# Patient Record
Sex: Female | Born: 1985 | Race: Asian | Hispanic: No | Marital: Married | Smoking: Never smoker
Health system: Southern US, Community
[De-identification: ages and names within clinical notes are randomized; demographics above are authoritative.]

## PROBLEM LIST (undated history)

## (undated) ENCOUNTER — Inpatient Hospital Stay (HOSPITAL_COMMUNITY): Payer: Self-pay

## (undated) DIAGNOSIS — E282 Polycystic ovarian syndrome: Secondary | ICD-10-CM

## (undated) DIAGNOSIS — O24419 Gestational diabetes mellitus in pregnancy, unspecified control: Secondary | ICD-10-CM

## (undated) DIAGNOSIS — D649 Anemia, unspecified: Secondary | ICD-10-CM

## (undated) HISTORY — DX: Gestational diabetes mellitus in pregnancy, unspecified control: O24.419

## (undated) HISTORY — DX: Anemia, unspecified: D64.9

## (undated) HISTORY — PX: NO PAST SURGERIES: SHX2092

---

## 2016-01-27 ENCOUNTER — Encounter (HOSPITAL_COMMUNITY): Payer: Self-pay

## 2016-01-27 ENCOUNTER — Inpatient Hospital Stay (HOSPITAL_COMMUNITY)
Admission: AD | Admit: 2016-01-27 | Discharge: 2016-01-27 | Disposition: A | Discharge status: Home / Self Care | Payer: 59 | Source: Ambulatory Visit | Attending: Obstetrics & Gynecology | Admitting: Obstetrics & Gynecology

## 2016-01-27 ENCOUNTER — Inpatient Hospital Stay (HOSPITAL_COMMUNITY): Payer: 59

## 2016-01-27 DIAGNOSIS — O26891 Other specified pregnancy related conditions, first trimester: Secondary | ICD-10-CM | POA: Diagnosis not present

## 2016-01-27 DIAGNOSIS — O468X1 Other antepartum hemorrhage, first trimester: Secondary | ICD-10-CM

## 2016-01-27 DIAGNOSIS — O99281 Endocrine, nutritional and metabolic diseases complicating pregnancy, first trimester: Secondary | ICD-10-CM | POA: Insufficient documentation

## 2016-01-27 DIAGNOSIS — R102 Pelvic and perineal pain: Secondary | ICD-10-CM

## 2016-01-27 DIAGNOSIS — O208 Other hemorrhage in early pregnancy: Secondary | ICD-10-CM | POA: Diagnosis present

## 2016-01-27 DIAGNOSIS — Z3A01 Less than 8 weeks gestation of pregnancy: Secondary | ICD-10-CM | POA: Insufficient documentation

## 2016-01-27 DIAGNOSIS — E282 Polycystic ovarian syndrome: Secondary | ICD-10-CM | POA: Diagnosis not present

## 2016-01-27 DIAGNOSIS — Z7984 Long term (current) use of oral hypoglycemic drugs: Secondary | ICD-10-CM | POA: Insufficient documentation

## 2016-01-27 DIAGNOSIS — Z3491 Encounter for supervision of normal pregnancy, unspecified, first trimester: Secondary | ICD-10-CM

## 2016-01-27 DIAGNOSIS — O418X10 Other specified disorders of amniotic fluid and membranes, first trimester, not applicable or unspecified: Secondary | ICD-10-CM

## 2016-01-27 HISTORY — DX: Polycystic ovarian syndrome: E28.2

## 2016-01-27 LAB — WET PREP, GENITAL
CLUE CELLS WET PREP: NONE SEEN
Sperm: NONE SEEN
TRICH WET PREP: NONE SEEN
YEAST WET PREP: NONE SEEN

## 2016-01-27 LAB — CBC
HEMATOCRIT: 37.5 % (ref 36.0–46.0)
Hemoglobin: 13.4 g/dL (ref 12.0–15.0)
MCH: 30.6 pg (ref 26.0–34.0)
MCHC: 35.7 g/dL (ref 30.0–36.0)
MCV: 85.6 fL (ref 78.0–100.0)
PLATELETS: 202 10*3/uL (ref 150–400)
RBC: 4.38 MIL/uL (ref 3.87–5.11)
RDW: 13.9 % (ref 11.5–15.5)
WBC: 7.3 10*3/uL (ref 4.0–10.5)

## 2016-01-27 LAB — URINE MICROSCOPIC-ADD ON
BACTERIA UA: NONE SEEN
WBC, UA: NONE SEEN WBC/hpf (ref 0–5)

## 2016-01-27 LAB — URINALYSIS, ROUTINE W REFLEX MICROSCOPIC
BILIRUBIN URINE: NEGATIVE
Glucose, UA: NEGATIVE mg/dL
KETONES UR: NEGATIVE mg/dL
LEUKOCYTES UA: NEGATIVE
NITRITE: NEGATIVE
Protein, ur: NEGATIVE mg/dL
pH: 5.5 (ref 5.0–8.0)

## 2016-01-27 LAB — HCG, QUANTITATIVE, PREGNANCY: hCG, Beta Chain, Quant, S: 1410 m[IU]/mL — ABNORMAL HIGH (ref <5)

## 2016-01-27 LAB — ABO/RH: ABO/RH(D): O POS

## 2016-01-27 LAB — POCT PREGNANCY, URINE: PREG TEST UR: POSITIVE — AB

## 2016-01-27 NOTE — MAU Note (Signed)
Started day before yesterday with spotting, positive UPT 2 weeks ago, today thicker sticky dark red, now dark red, intercourse 2 days ago, cramping onset today, LMP 12/12/15, has history of irregular periods.

## 2016-01-27 NOTE — Discharge Instructions (Signed)
Abdominal Pain During Pregnancy °Abdominal pain is common in pregnancy. Most of the time, it does not cause harm. There are many causes of abdominal pain. Some causes are more serious than others. Some of the causes of abdominal pain in pregnancy are easily diagnosed. Occasionally, the diagnosis takes time to understand. Other times, the cause is not determined. Abdominal pain can be a sign that something is very wrong with the pregnancy, or the pain may have nothing to do with the pregnancy at all. For this reason, always tell your health care provider if you have any abdominal discomfort. °HOME CARE INSTRUCTIONS  °Monitor your abdominal pain for any changes. The following actions may help to alleviate any discomfort you are experiencing: °· Do not have sexual intercourse or put anything in your vagina until your symptoms go away completely. °· Get plenty of rest until your pain improves. °· Drink clear fluids if you feel nauseous. Avoid solid food as long as you are uncomfortable or nauseous. °· Only take over-the-counter or prescription medicine as directed by your health care provider. °· Keep all follow-up appointments with your health care provider. °SEEK IMMEDIATE MEDICAL CARE IF: °· You are bleeding, leaking fluid, or passing tissue from the vagina. °· You have increasing pain or cramping. °· You have persistent vomiting. °· You have painful or bloody urination. °· You have a fever. °· You notice a decrease in your baby's movements. °· You have extreme weakness or feel faint. °· You have shortness of breath, with or without abdominal pain. °· You develop a severe headache with abdominal pain. °· You have abnormal vaginal discharge with abdominal pain. °· You have persistent diarrhea. °· You have abdominal pain that continues even after rest, or gets worse. °MAKE SURE YOU:  °· Understand these instructions. °· Will watch your condition. °· Will get help right away if you are not doing well or get worse. °    °This information is not intended to replace advice given to you by your health care provider. Make sure you discuss any questions you have with your health care provider. °  °Document Released: 05/14/2005 Document Revised: 03/04/2013 Document Reviewed: 12/11/2012 °Elsevier Interactive Patient Education ©2016 Elsevier Inc. °Vaginal Bleeding During Pregnancy, First Trimester °A small amount of bleeding (spotting) from the vagina is relatively common in early pregnancy. It usually stops on its own. Various things may cause bleeding or spotting in early pregnancy. Some bleeding may be related to the pregnancy, and some may not. In most cases, the bleeding is normal and is not a problem. However, bleeding can also be a sign of something serious. Be sure to tell your health care provider about any vaginal bleeding right away. °Some possible causes of vaginal bleeding during the first trimester include: °· Infection or inflammation of the cervix. °· Growths (polyps) on the cervix. °· Miscarriage or threatened miscarriage. °· Pregnancy tissue has developed outside of the uterus and in a fallopian tube (tubal pregnancy). °· Tiny cysts have developed in the uterus instead of pregnancy tissue (molar pregnancy). °HOME CARE INSTRUCTIONS  °Watch your condition for any changes. The following actions may help to lessen any discomfort you are feeling: °· Follow your health care provider's instructions for limiting your activity. If your health care provider orders bed rest, you may need to stay in bed and only get up to use the bathroom. However, your health care provider may allow you to continue light activity. °· If needed, make plans for someone to help with   with your regular activities and responsibilities while you are on bed rest.  Keep track of the number of pads you use each day, how often you change pads, and how soaked (saturated) they are. Write this down.  Do not use tampons. Do not douche.  Do not have sexual  intercourse or orgasms until approved by your health care provider.  If you pass any tissue from your vagina, save the tissue so you can show it to your health care provider.  Only take over-the-counter or prescription medicines as directed by your health care provider.  Do not take aspirin because it can make you bleed.  Keep all follow-up appointments as directed by your health care provider. SEEK MEDICAL CARE IF:  You have any vaginal bleeding during any part of your pregnancy.  You have cramps or labor pains.  You have a fever, not controlled by medicine. SEEK IMMEDIATE MEDICAL CARE IF:   You have severe cramps in your back or belly (abdomen).  You pass large clots or tissue from your vagina.  Your bleeding increases.  You feel light-headed or weak, or you have fainting episodes.  You have chills.  You are leaking fluid or have a gush of fluid from your vagina.  You pass out while having a bowel movement. MAKE SURE YOU:  Understand these instructions.  Will watch your condition.  Will get help right away if you are not doing well or get worse.   This information is not intended to replace advice given to you by your health care provider. Make sure you discuss any questions you have with your health care provider.   Document Released: 02/21/2005 Document Revised: 05/19/2013 Document Reviewed: 01/19/2013 Elsevier Interactive Patient Education [employer]2016 Elsevier Inc.

## 2016-01-27 NOTE — MAU Provider Note (Signed)
Chief Complaint: Vaginal Bleeding and Abdominal Cramping   First Provider Initiated Contact with Patient 01/27/16 1245      SUBJECTIVE HPI: Wanda Roberts is a 29 y.o. G1P0 at 103w4d by LMP who presents to maternity admissions reporting positive pregnancy test 2 weeks ago, and onset of light spotting 2 days ago becoming a little heavier today and associated with pelvic cramping with onset today.  The pain is constant and unchanged, the bleeding is gradually worsening.  Nothing makes her bleeding or pain better or worse.  She is sure of her LMP but has irregular periods r/t PCOS.  She has initial OB visit with [facility] on 9/11.  She denies vaginal itching/burning, urinary symptoms, h/a, dizziness, n/v, or fever/chills.     HPI  Past Medical History:  Diagnosis Date  . PCOS (polycystic ovarian syndrome)    History reviewed. No pertinent surgical history. Social History   Social History  . Marital status: Married    Spouse name: N/A  . Number of children: N/A  . Years of education: N/A   Occupational History  . Not on file.   Social History Main Topics  . Smoking status: Never Smoker  . Smokeless tobacco: Never Used  . Alcohol use No  . Drug use: No  . Sexual activity: Yes    Birth control/ protection: None   Other Topics Concern  . Not on file   Social History Narrative  . No narrative on file   No current facility-administered medications on file prior to encounter.    No current outpatient prescriptions on file prior to encounter.   No Known Allergies  ROS:  Review of Systems  Constitutional: Negative for chills, fatigue and fever.  Respiratory: Negative for shortness of breath.   Cardiovascular: Negative for chest pain.  Genitourinary: Positive for pelvic pain and vaginal bleeding. Negative for difficulty urinating, dysuria, flank pain, vaginal discharge and vaginal pain.  Neurological: Negative for dizziness and headaches.   Psychiatric/Behavioral: Negative.      I have reviewed patient's Past Medical Hx, Surgical Hx, Family Hx, Social Hx, medications and allergies.   Physical Exam   Patient Vitals for the past 24 hrs:  BP Temp Temp src Pulse Resp Height Weight  01/27/16 1222 135/89 98.1 F (36.7 C) Oral 71 18 4\' 11"  (1.499 m) 145 lb 9.6 oz (66 kg)   Constitutional: Well-developed, well-nourished female in no acute distress.  Cardiovascular: normal rate Respiratory: normal effort GI: Abd soft, non-tender. Pos BS x 4 MS: Extremities nontender, no edema, normal ROM Neurologic: Alert and oriented x 4.  GU: Neg CVAT.  PELVIC EXAM: Cervix pink, visually closed, without lesion, small amount dark red bleeding, vaginal walls and external genitalia normal Bimanual exam: Cervix 0/long/high, firm, anterior, neg CMT, uterus nontender, nonenlarged, adnexa without tenderness, enlargement, or mass   LAB RESULTS Results for orders placed or performed during the hospital encounter of 01/27/16 (from the past 24 hour(s))  Urinalysis, Routine w reflex microscopic (not at [facility])     Status: Abnormal   Collection Time: 01/27/16 12:20 PM  Result Value Ref Range   Color, Urine STRAW (A) YELLOW   APPearance CLEAR CLEAR   Specific Gravity, Urine <1.005 (L) 1.005 - 1.030   pH 5.5 5.0 - 8.0   Glucose, UA NEGATIVE NEGATIVE mg/dL   Hgb urine dipstick MODERATE (A) NEGATIVE   Bilirubin Urine NEGATIVE NEGATIVE   Ketones, ur NEGATIVE NEGATIVE mg/dL   Protein, ur NEGATIVE NEGATIVE mg/dL   Nitrite  NEGATIVE NEGATIVE   Leukocytes, UA NEGATIVE NEGATIVE  Urine microscopic-add on     Status: Abnormal   Collection Time: 01/27/16 12:20 PM  Result Value Ref Range   Squamous Epithelial / LPF 0-5 (A) NONE SEEN   WBC, UA NONE SEEN 0 - 5 WBC/hpf   RBC / HPF 0-5 0 - 5 RBC/hpf   Bacteria, UA NONE SEEN NONE SEEN  Pregnancy, urine POC     Status: Abnormal   Collection Time: 01/27/16 12:49 PM  Result Value Ref Range   Preg Test, Ur  POSITIVE (A) NEGATIVE  Wet prep, genital     Status: Abnormal   Collection Time: 01/27/16 12:59 PM  Result Value Ref Range   Yeast Wet Prep HPF POC NONE SEEN NONE SEEN   Trich, Wet Prep NONE SEEN NONE SEEN   Clue Cells Wet Prep HPF POC NONE SEEN NONE SEEN   WBC, Wet Prep HPF POC MODERATE (A) NONE SEEN   Sperm NONE SEEN   CBC     Status: None   Collection Time: 01/27/16  1:00 PM  Result Value Ref Range   WBC 7.3 4.0 - 10.5 K/uL   RBC 4.38 3.87 - 5.11 MIL/uL   Hemoglobin 13.4 12.0 - 15.0 g/dL   HCT 65.737.5 84.636.0 - 96.246.0 %   MCV 85.6 78.0 - 100.0 fL   MCH 30.6 26.0 - 34.0 pg   MCHC 35.7 30.0 - 36.0 g/dL   RDW 95.213.9 84.111.5 - 32.415.5 %   Platelets 202 150 - 400 K/uL  hCG, quantitative, pregnancy     Status: Abnormal   Collection Time: 01/27/16  1:00 PM  Result Value Ref Range   hCG, Beta Chain, Quant, S 1,410 (H) <5 mIU/mL  ABO/Rh     Status: None   Collection Time: 01/27/16  1:00 PM  Result Value Ref Range   ABO/RH(D) O POS     --/--/O POS (09/01 1300)  IMAGING [place] Ob Comp Less 14 Wks  Result Date: 01/27/2016 CLINICAL DATA:  30 year old pregnant female presenting with pelvic pain and spotting. Quantitative beta HCG 1,410. EDC by LMP: 09/17/2016, projecting to an expected gestational age of [redacted] weeks 4 days. EXAM: OBSTETRIC <14 WK US AND TRANSVAGINAL OB US TECHNIQUE: Both transabdominal and transvaginal ultrasound examinations were performed for complete evaluation of the gestation as well as the maternal uterus, adnexal regions, and pelvic cul-de-sac. Transvaginal technique was performed to assess early pregnancy. COMPARISON:  None. FINDINGS: Intrauterine gestational sac: There is a tiny single intrauterine gestational sac with double decidual sac sign. Yolk sac:  Present. Embryo:  Not visualized. Embryonic Cardiac Activity: Not visualized. MSD: 3.0  mm   5 w   1  d Subchorionic hemorrhage: Small perigestational bleed involving approximately 20-30% of the gestational sac circumference. Maternal  uterus/adnexae: Right ovary measures 4.4 x 3.8 x 4.7 cm and contains a 3.5 x 2.4 x 3.9 cm corpus luteum cyst. Left ovary measures 2.9 x 1.3 x 1.2 cm. No suspicious ovarian or adnexal masses. No abnormal free fluid the pelvis. There is a 1.3 x 1.4 x 1.1 cm intramural focus in the right anterior uterine body, which could represent a small intramural fibroid. IMPRESSION: 1. Single intrauterine gestational sac with yolk sac measuring 5 weeks 1 day by mean sac diameter. No embryo detected, which could be due to early gestational age. Small perigestational bleed. Follow-up obstetric scan is recommended in 11-14 days. This recommendation follows SRU consensus guidelines: Diagnostic Criteria for Nonviable Pregnancy Early in the First  Trimester. Malva Limes Med 2013; 829:5621-30. 2. Possible small intramural fibroid in the right anterior uterine body. 3. Right ovarian simple 3.9 cm cyst, probably a corpus luteum cyst. No suspicious ovarian or adnexal masses. Electronically Signed   By: Delbert Phenix M.D.   On: 01/27/2016 14:49   US Ob Transvaginal  Result Date: 01/27/2016 CLINICAL DATA:  30 year old pregnant female presenting with pelvic pain and spotting. Quantitative beta HCG 1,410. EDC by LMP: 09/17/2016, projecting to an expected gestational age of [redacted] weeks 4 days. EXAM: OBSTETRIC <14 WK [place] AND TRANSVAGINAL OB US TECHNIQUE: Both transabdominal and transvaginal ultrasound examinations were performed for complete evaluation of the gestation as well as the maternal uterus, adnexal regions, and pelvic cul-de-sac. Transvaginal technique was performed to assess early pregnancy. COMPARISON:  None. FINDINGS: Intrauterine gestational sac: There is a tiny single intrauterine gestational sac with double decidual sac sign. Yolk sac:  Present. Embryo:  Not visualized. Embryonic Cardiac Activity: Not visualized. MSD: 3.0  mm   5 w   1  d Subchorionic hemorrhage: Small perigestational bleed involving approximately 20-30% of the  gestational sac circumference. Maternal uterus/adnexae: Right ovary measures 4.4 x 3.8 x 4.7 cm and contains a 3.5 x 2.4 x 3.9 cm corpus luteum cyst. Left ovary measures 2.9 x 1.3 x 1.2 cm. No suspicious ovarian or adnexal masses. No abnormal free fluid the pelvis. There is a 1.3 x 1.4 x 1.1 cm intramural focus in the right anterior uterine body, which could represent a small intramural fibroid. IMPRESSION: 1. Single intrauterine gestational sac with yolk sac measuring 5 weeks 1 day by mean sac diameter. No embryo detected, which could be due to early gestational age. Small perigestational bleed. Follow-up obstetric scan is recommended in 11-14 days. This recommendation follows SRU consensus guidelines: Diagnostic Criteria for Nonviable Pregnancy Early in the First Trimester. Malva Limes Med 2013; 865:7846-96. 2. Possible small intramural fibroid in the right anterior uterine body. 3. Right ovarian simple 3.9 cm cyst, probably a corpus luteum cyst. No suspicious ovarian or adnexal masses. Electronically Signed   By: Delbert Phenix M.D.   On: 01/27/2016 14:49    MAU Management/MDM: Ordered labs and reviewed results.  IUP on [place], GS and YS with no FP yet.  Small SCH noted.  Pt to f/u in office as scheduled for appointment/ultrasound.  Bleeding precautions reviewed. Pt stable at time of discharge.  ASSESSMENT 1. Normal IUP (intrauterine pregnancy) on prenatal ultrasound, first trimester   2. Pelvic pain affecting pregnancy in first trimester, antepartum   3. Subchorionic hemorrhage in first trimester     PLAN Discharge home with bleeding precautions    Medication List    TAKE these medications   metFORMIN 1000 MG tablet Commonly known as:  GLUCOPHAGE Take 1,000 mg by mouth at bedtime.   prenatal multivitamin Tabs tablet Take 1 tablet by mouth daily at 12 noon.      Follow-up Information    [facility] .   Why:  As scheduled for appointment Contact information: [address] [place] [address] [phone]           Sharen Counter Certified Nurse-Midwife 01/27/2016  3:21 PM

## 2016-01-28 LAB — HIV ANTIBODY (ROUTINE TESTING W REFLEX): HIV SCREEN 4TH GENERATION: NONREACTIVE

## 2016-01-29 ENCOUNTER — Inpatient Hospital Stay (HOSPITAL_COMMUNITY)
Admission: AD | Admit: 2016-01-29 | Discharge: 2016-01-30 | Disposition: A | Discharge status: Home / Self Care | Payer: 59 | Source: Ambulatory Visit | Attending: Obstetrics and Gynecology | Admitting: Obstetrics and Gynecology

## 2016-01-29 ENCOUNTER — Inpatient Hospital Stay (HOSPITAL_COMMUNITY): Payer: 59

## 2016-01-29 ENCOUNTER — Encounter (HOSPITAL_COMMUNITY): Payer: Self-pay

## 2016-01-29 DIAGNOSIS — Z3A01 Less than 8 weeks gestation of pregnancy: Secondary | ICD-10-CM | POA: Diagnosis not present

## 2016-01-29 DIAGNOSIS — O209 Hemorrhage in early pregnancy, unspecified: Secondary | ICD-10-CM | POA: Diagnosis present

## 2016-01-29 DIAGNOSIS — O3680X0 Pregnancy with inconclusive fetal viability, not applicable or unspecified: Secondary | ICD-10-CM

## 2016-01-29 DIAGNOSIS — O200 Threatened abortion: Secondary | ICD-10-CM | POA: Diagnosis not present

## 2016-01-29 LAB — URINALYSIS, ROUTINE W REFLEX MICROSCOPIC
BILIRUBIN URINE: NEGATIVE
Glucose, UA: NEGATIVE mg/dL
KETONES UR: NEGATIVE mg/dL
Leukocytes, UA: NEGATIVE
NITRITE: NEGATIVE
PROTEIN: NEGATIVE mg/dL
Specific Gravity, Urine: <1.005 — ABNORMAL LOW (ref 1.005–1.030)
pH: 5 (ref 5.0–8.0)

## 2016-01-29 LAB — HCG, QUANTITATIVE, PREGNANCY: hCG, Beta Chain, Quant, S: 2126 m[IU]/mL — ABNORMAL HIGH (ref <5)

## 2016-01-29 LAB — CBC
HEMATOCRIT: 37.2 % (ref 36.0–46.0)
HEMOGLOBIN: 13.3 g/dL (ref 12.0–15.0)
MCH: 30.4 pg (ref 26.0–34.0)
MCHC: 35.8 g/dL (ref 30.0–36.0)
MCV: 84.9 fL (ref 78.0–100.0)
Platelets: 201 10*3/uL (ref 150–400)
RBC: 4.38 MIL/uL (ref 3.87–5.11)
RDW: 13.9 % (ref 11.5–15.5)
WBC: 9.4 10*3/uL (ref 4.0–10.5)

## 2016-01-29 LAB — URINE MICROSCOPIC-ADD ON

## 2016-01-29 NOTE — MAU Provider Note (Signed)
History   [id number]   Chief Complaint  Patient presents with  . Vaginal Bleeding    HPI Wanda Roberts is a 30 y.o. female  G1P0 at 7324w6d IUP here with report of increased vaginal bleeding and passing clots today.  Bleeding associated with mild pelvic cramping.  Seen in MAU on 01/27/16 for light spotting and cramping.  BHCG was 1410 and ultrasound showed single intrauterine gestational sac with yolk sac measuring 5 weeks 1 day by mean sac diameter. No embryo detected, which could be due to early gestational age. Small perigestational bleed.    Patient's last menstrual period was 12/12/2015.  OB History  Gravida Para Term Preterm AB Living  1            SAB TAB Ectopic Multiple Live Births               # Outcome Date GA Lbr Len/2nd Weight Sex Delivery Anes PTL Lv  1 Current               Past Medical History:  Diagnosis Date  . PCOS (polycystic ovarian syndrome)     Family History  Problem Relation Age of Onset  . Diabetes Father   . Cancer Maternal Grandmother     Social History   Social History  . Marital status: Married    Spouse name: N/A  . Number of children: N/A  . Years of education: N/A   Social History Main Topics  . Smoking status: Never Smoker  . Smokeless tobacco: Never Used  . Alcohol use No  . Drug use: No  . Sexual activity: Yes    Birth control/ protection: None   Other Topics Concern  . None   Social History Narrative  . None    No Known Allergies  No current facility-administered medications on file prior to encounter.    Current Outpatient Prescriptions on File Prior to Encounter  Medication Sig Dispense Refill  . metFORMIN (GLUCOPHAGE) 1000 MG tablet Take 1,000 mg by mouth at bedtime.    . Prenatal Vit-Fe Fumarate-FA (PRENATAL MULTIVITAMIN) TABS tablet Take 1 tablet by mouth daily at 12 noon.       Review of Systems  Genitourinary: Positive for pelvic pain (cramping) and vaginal bleeding.  All other systems reviewed and  are negative.    Physical Exam   Vitals:   01/29/16 2200  BP: 128/89  Pulse: 78  Resp: 17  Temp: 99.2 F (37.3 C)  TempSrc: Oral  SpO2: 97%  Weight: 145 lb (65.8 kg)  Height: 4\' 11"  (1.499 m)    Physical Exam  Constitutional: She is oriented to person, place, and time. She appears well-developed and well-nourished.  HENT:  Head: Normocephalic.  Neck: Normal range of motion. Neck supple.  Cardiovascular: Normal rate, regular rhythm and normal heart sounds.   Respiratory: Effort normal and breath sounds normal. No respiratory distress.  GI: Soft. There is no tenderness.  Genitourinary: Cervix exhibits no motion tenderness. There is bleeding (scant, no clots seen) in the vagina.  Musculoskeletal: Normal range of motion. She exhibits no edema.  Neurological: She is alert and oriented to person, place, and time.  Skin: Skin is warm and dry.   2220 Report given to Dr. Elon SpannerLeger > Reviewed HPI/Exam/prior visit > give bleeding precautions and have patient follow-up in office; may do ultrasound if patient requests  2230 After discussing options with patient; pt desires to see if BHCG levels are rising as expected > BHCG ordered MAU  Course  Procedures  MDM Results for orders placed or performed during the hospital encounter of 01/29/16 (from the past 24 hour(s))  Urinalysis, Routine w reflex microscopic (not at [facility])     Status: Abnormal   Collection Time: 01/29/16  9:50 PM  Result Value Ref Range   Color, Urine YELLOW YELLOW   APPearance CLEAR CLEAR   Specific Gravity, Urine <1.005 (L) 1.005 - 1.030   pH 5.0 5.0 - 8.0   Glucose, UA NEGATIVE NEGATIVE mg/dL   Hgb urine dipstick LARGE (A) NEGATIVE   Bilirubin Urine NEGATIVE NEGATIVE   Ketones, ur NEGATIVE NEGATIVE mg/dL   Protein, ur NEGATIVE NEGATIVE mg/dL   Nitrite NEGATIVE NEGATIVE   Leukocytes, UA NEGATIVE NEGATIVE  Urine microscopic-add on     Status: Abnormal   Collection Time: 01/29/16  9:50 PM  Result Value Ref Range    Squamous Epithelial / LPF 0-5 (A) NONE SEEN   WBC, UA 0-5 0 - 5 WBC/hpf   RBC / HPF 6-30 0 - 5 RBC/hpf   Bacteria, UA FEW (A) NONE SEEN  hCG, quantitative, pregnancy     Status: Abnormal   Collection Time: 01/29/16 10:33 PM  Result Value Ref Range   hCG, Beta Chain, Quant, S 2,126 (H) <5 mIU/mL  CBC     Status: None   Collection Time: 01/29/16 10:33 PM  Result Value Ref Range   WBC 9.4 4.0 - 10.5 K/uL   RBC 4.38 3.87 - 5.11 MIL/uL   Hemoglobin 13.3 12.0 - 15.0 g/dL   HCT 16.1 09.6 - 04.5 %   MCV 84.9 78.0 - 100.0 fL   MCH 30.4 26.0 - 34.0 pg   MCHC 35.8 30.0 - 36.0 g/dL   RDW 40.9 81.1 - 91.4 %   Platelets 201 150 - 400 K/uL    Ultrasound: Cystic structure in the endometrium as described may represent an early gestational sac, or a blighted ovum. No definite yolk sac or fetal pole identified at this time. Correlation with serial HCG levels and follow-up with ultrasound recommended.  Assessment and Plan  First Trimester Bleeding  Plan: Discharge home Bleeding precautions Keep scheduled appointment for Tuesday   Marlis Edelson, [place] 01/29/2016 11:57 PM

## 2016-01-29 NOTE — MAU Note (Signed)
Pt states that she had some brown spotting a few days ago, but now it is a little heavier and has passed some small clots. Having some mild abdominal cramping.

## 2016-01-29 NOTE — Discharge Instructions (Signed)
BHCG: 9/1 1410 9/3 2126

## 2016-01-30 ENCOUNTER — Encounter (HOSPITAL_COMMUNITY): Payer: Self-pay | Admitting: *Deleted

## 2016-01-30 ENCOUNTER — Inpatient Hospital Stay (EMERGENCY_DEPARTMENT_HOSPITAL)
Admission: AD | Admit: 2016-01-30 | Discharge: 2016-01-30 | Disposition: A | Discharge status: Home / Self Care | Payer: 59 | Source: Ambulatory Visit | Attending: Obstetrics and Gynecology | Admitting: Obstetrics and Gynecology

## 2016-01-30 DIAGNOSIS — O200 Threatened abortion: Secondary | ICD-10-CM

## 2016-01-30 DIAGNOSIS — Z3A01 Less than 8 weeks gestation of pregnancy: Secondary | ICD-10-CM | POA: Insufficient documentation

## 2016-01-30 DIAGNOSIS — O209 Hemorrhage in early pregnancy, unspecified: Secondary | ICD-10-CM

## 2016-01-30 LAB — CBC
HEMATOCRIT: 37.5 % (ref 36.0–46.0)
Hemoglobin: 13.4 g/dL (ref 12.0–15.0)
MCH: 30.5 pg (ref 26.0–34.0)
MCHC: 35.7 g/dL (ref 30.0–36.0)
MCV: 85.2 fL (ref 78.0–100.0)
Platelets: 198 10*3/uL (ref 150–400)
RBC: 4.4 MIL/uL (ref 3.87–5.11)
RDW: 13.9 % (ref 11.5–15.5)
WBC: 10.2 10*3/uL (ref 4.0–10.5)

## 2016-01-30 LAB — HCG, QUANTITATIVE, PREGNANCY: hCG, Beta Chain, Quant, S: 2114 m[IU]/mL — ABNORMAL HIGH (ref <5)

## 2016-01-30 NOTE — Discharge Instructions (Signed)
Miscarriage  A miscarriage is the sudden loss of an unborn baby (fetus) before the 20th week of pregnancy. Most miscarriages happen in the first 3 months of pregnancy. Sometimes, it happens before a woman even knows she is pregnant. A miscarriage is also called a "spontaneous miscarriage" or "early pregnancy loss." Having a miscarriage can be an emotional experience. Talk with your caregiver about any questions you may have about miscarrying, the grieving process, and your future pregnancy plans.  CAUSES    Problems with the fetal chromosomes that make it impossible for the baby to develop normally. Problems with the baby's genes or chromosomes are most often the result of errors that occur, by chance, as the embryo divides and grows. The problems are not inherited from the parents.   Infection of the cervix or uterus.    Hormone problems.    Problems with the cervix, such as having an incompetent cervix. This is when the tissue in the cervix is not strong enough to hold the pregnancy.    Problems with the uterus, such as an abnormally shaped uterus, uterine fibroids, or congenital abnormalities.    Certain medical conditions.    Smoking, drinking alcohol, or taking illegal drugs.    Trauma.   Often, the cause of a miscarriage is unknown.   SYMPTOMS    Vaginal bleeding or spotting, with or without cramps or pain.   Pain or cramping in the abdomen or lower back.   Passing fluid, tissue, or blood clots from the vagina.  DIAGNOSIS   Your caregiver will perform a physical exam. You may also have an ultrasound to confirm the miscarriage. Blood or urine tests may also be ordered.  TREATMENT    Sometimes, treatment is not necessary if you naturally pass all the fetal tissue that was in the uterus. If some of the fetus or placenta remains in the body (incomplete miscarriage), tissue left behind may become infected and must be removed. Usually, a dilation and curettage (D and C) procedure is performed.  During a D and C procedure, the cervix is widened (dilated) and any remaining fetal or placental tissue is gently removed from the uterus.   Antibiotic medicines are prescribed if there is an infection. Other medicines may be given to reduce the size of the uterus (contract) if there is a lot of bleeding.   If you have Rh negative blood and your baby was Rh positive, you will need a Rh immunoglobulin shot. This shot will protect any future baby from having Rh blood problems in future pregnancies.  HOME CARE INSTRUCTIONS    Your caregiver may order bed rest or may allow you to continue light activity. Resume activity as directed by your caregiver.   Have someone help with home and family responsibilities during this time.    Keep track of the number of sanitary pads you use each day and how soaked (saturated) they are. Write down this information.    Do not use tampons. Do not douche or have sexual intercourse until approved by your caregiver.    Only take over-the-counter or prescription medicines for pain or discomfort as directed by your caregiver.    Do not take aspirin. Aspirin can cause bleeding.    Keep all follow-up appointments with your caregiver.    If you or your partner have problems with grieving, talk to your caregiver or seek counseling to help cope with the pregnancy loss. Allow enough time to grieve before trying to get pregnant again.     SEEK IMMEDIATE MEDICAL CARE IF:    You have severe cramps or pain in your back or abdomen.   You have a fever.   You pass large blood clots (walnut-sized or larger) ortissue from your vagina. Save any tissue for your caregiver to inspect.    Your bleeding increases.    You have a thick, bad-smelling vaginal discharge.   You become lightheaded, weak, or you faint.    You have chills.   MAKE SURE YOU:   Understand these instructions.   Will watch your condition.   Will get help right away if you are not doing well or get worse.     This  information is not intended to replace advice given to you by your health care provider. Make sure you discuss any questions you have with your health care provider.     Document Released: 11/07/2000 Document Revised: 09/08/2012 Document Reviewed: 07/03/2011  Elsevier Interactive Patient Education 2016 [employer]

## 2016-01-30 NOTE — MAU Provider Note (Signed)
History     CSN: [id number]  Arrival date and time: 01/30/16 2028   First Provider Initiated Contact with Patient 01/30/16 2108      Chief Complaint  Patient presents with  . Vaginal Bleeding   Vaginal Bleeding  The patient's primary symptoms include pelvic pain and vaginal bleeding. This is a new problem. The current episode started 1 to 4 weeks ago. The problem occurs constantly. The problem has been gradually worsening. The problem affects both sides. She is pregnant. Associated symptoms include abdominal pain. Pertinent negatives include no chills, constipation, diarrhea, dysuria, fever, frequency, nausea, urgency or vomiting. The vaginal discharge was bloody. The vaginal bleeding is lighter than menses. She has been passing clots (about the size of a quarter. ). She has not been passing tissue (unsure ). Nothing aggravates the symptoms. She has tried acetaminophen for the symptoms. The treatment provided no relief. Menstrual history: LMP 12/12/15     Past Medical History:  Diagnosis Date  . PCOS (polycystic ovarian syndrome)     History reviewed. No pertinent surgical history.  Family History  Problem Relation Age of Onset  . Diabetes Father   . Cancer Maternal Grandmother     Social History  Substance Use Topics  . Smoking status: Never Smoker  . Smokeless tobacco: Never Used  . Alcohol use No    Allergies: No Known Allergies  Prescriptions Prior to Admission  Medication Sig Dispense Refill Last Dose  . metFORMIN (GLUCOPHAGE) 1000 MG tablet Take 1,000 mg by mouth at bedtime.   01/29/2016 at Unknown time  . Prenatal Vit-Fe Fumarate-FA (PRENATAL MULTIVITAMIN) TABS tablet Take 1 tablet by mouth daily at 12 noon.   01/29/2016 at Unknown time    Review of Systems  Constitutional: Negative for chills and fever.  Gastrointestinal: Positive for abdominal pain. Negative for constipation, diarrhea, nausea and vomiting.  Genitourinary: Positive for pelvic pain and vaginal  bleeding. Negative for dysuria, frequency and urgency.   Physical Exam   Blood pressure 128/80, pulse 85, temperature 98.5 F (36.9 C), temperature source Oral, resp. rate 20, height 4\' 11"  (1.499 m), weight 145 lb 8 oz (66 kg), last menstrual period 12/12/2015.  Physical Exam  Nursing note and vitals reviewed. Constitutional: She is oriented to person, place, and time. She appears well-developed and well-nourished. No distress.  HENT:  Head: Normocephalic.  Cardiovascular: Normal rate.   Respiratory: Effort normal.  GI: Soft.  Musculoskeletal: Normal range of motion.  Neurological: She is alert and oriented to person, place, and time.  Skin: Skin is warm and dry.  Psychiatric: She has a normal mood and affect.   Results for GREGG, HOLSTER (MRN [id number]) as of 01/30/2016 22:41  Ref. Range 01/27/2016 13:00 01/29/2016 21:50 01/29/2016 22:33 01/30/2016 20:42  HCG, Beta Chain, Quant, S Latest Ref Range: <5 mIU/mL 1,410 (H)  2,126 (H) 2,114 (H)   MAU Course  Procedures  MDM Reviewed [place] from 9/1 (unable to see images in epic, but [place] tech loaded images from PACS for me to view). I was unable to discern a yolk sac from the first [place] and the second [place]. Unsure if there is any significant change from 9/1 to 9/3.   2259: D/W Dr. Marcelle Overlie, ok for DC home. FU in the office tomorrow as planned.   Assessment and Plan   1. Threatened abortion in early pregnancy   2. Vaginal bleeding in pregnancy, first trimester    DC home Comfort measures reviewed  1st Trimester precautions  Bleeding precautions  Ectopic precautions RX: none  Return to MAU as needed FU with OB as planned  Follow-up Information    ADKINS,GRETCHEN, MD .   Specialty:  Obstetrics and Gynecology Contact information: [address], [address] [place] [id number] [phone]            Tawnya CrookHogan, Jalexis Breed Donovan 01/30/2016, 9:21 PM

## 2016-01-30 NOTE — MAU Note (Signed)
PT  SAYS SHE WAS HERE  LAST NIGHT    WITH VAG  BLEEDING-   MILD  CRAMPS.    TODAY STARTED CRAMPING  STRONGER.    SHE TOOK TYLENOL REG STR -    2 TABS  -  NO RELIEF  .    IN B-ROOM  BEFORE  SHE   CAME - PASSED  SOMETHING.  BLEEDING  IS MORE  THAN  YESTERDAY .   IN TRIAGE  - NOTHING  ON PAD

## 2016-01-31 LAB — GC/CHLAMYDIA PROBE AMP (~~LOC~~) NOT AT ARMC
Chlamydia: NEGATIVE
Neisseria Gonorrhea: NEGATIVE

## 2016-08-27 LAB — OB RESULTS CONSOLE ANTIBODY SCREEN: Antibody Screen: NEGATIVE

## 2016-08-27 LAB — OB RESULTS CONSOLE RPR: RPR: NONREACTIVE

## 2016-08-27 LAB — OB RESULTS CONSOLE HIV ANTIBODY (ROUTINE TESTING): HIV: NONREACTIVE

## 2016-08-27 LAB — OB RESULTS CONSOLE ABO/RH: RH Type: POSITIVE

## 2016-08-27 LAB — OB RESULTS CONSOLE HEPATITIS B SURFACE ANTIGEN: HEP B S AG: NEGATIVE

## 2016-08-27 LAB — OB RESULTS CONSOLE GC/CHLAMYDIA
CHLAMYDIA, DNA PROBE: NEGATIVE
Gonorrhea: NEGATIVE

## 2016-08-27 LAB — OB RESULTS CONSOLE RUBELLA ANTIBODY, IGM: Rubella: IMMUNE

## 2016-12-01 ENCOUNTER — Encounter (HOSPITAL_COMMUNITY): Payer: Self-pay

## 2017-01-16 ENCOUNTER — Encounter: Payer: 59 | Attending: Obstetrics and Gynecology | Admitting: Registered"

## 2017-01-16 DIAGNOSIS — E119 Type 2 diabetes mellitus without complications: Secondary | ICD-10-CM | POA: Insufficient documentation

## 2017-01-16 DIAGNOSIS — Z713 Dietary counseling and surveillance: Secondary | ICD-10-CM | POA: Insufficient documentation

## 2017-01-16 DIAGNOSIS — R7309 Other abnormal glucose: Secondary | ICD-10-CM

## 2017-01-21 ENCOUNTER — Encounter: Payer: Self-pay | Admitting: Registered"

## 2017-01-21 NOTE — Progress Notes (Signed)
Patient was seen on 01/16/2017 for Gestational Diabetes self-management class at the [facility]. The following learning objectives were met by the patient during this course:   States the definition of Gestational Diabetes  States why dietary management is important in controlling blood glucose  Describes the effects each nutrient has on blood glucose levels  Demonstrates ability to create a balanced meal plan  Demonstrates carbohydrate counting   States when to check blood glucose levels  Demonstrates proper blood glucose monitoring techniques  States the effect of stress and exercise on blood glucose levels  States the importance of limiting caffeine and abstaining from alcohol and smoking  Blood glucose monitor given: [employer] Lot # [id number] X Exp: 02/24/2017 Blood glucose reading: 81  Patient instructed to monitor glucose levels: FBS: 60 - <95 1 hour: <140 2 hour: <120  Patient received handouts:  Nutrition Diabetes and Pregnancy  Carbohydrate Counting List  Patient will be seen for follow-up as needed.

## 2017-03-22 ENCOUNTER — Telehealth (HOSPITAL_COMMUNITY): Payer: Self-pay | Admitting: *Deleted

## 2017-03-22 ENCOUNTER — Encounter (HOSPITAL_COMMUNITY): Payer: Self-pay | Admitting: *Deleted

## 2017-03-22 NOTE — Telephone Encounter (Signed)
Preadmission screen  

## 2017-03-23 LAB — OB RESULTS CONSOLE GBS: GBS: NEGATIVE

## 2017-03-28 NOTE — H&P (Signed)
Wanda Roberts is a 31 y.o. female G1 presenting for IOL.  Pregnancy complicated by A2DM, BG controlled with glyburide - somewhat poor control despite increase in glyburide 5mg  bid.   OB History    Gravida Para Term Preterm AB Living   2       1     SAB TAB Ectopic Multiple Live Births   1             Past Medical History:  Diagnosis Date  . Anemia   . Gestational diabetes   . PCOS (polycystic ovarian syndrome)    Past Surgical History:  Procedure Laterality Date  . NO PAST SURGERIES     Family History: family history includes Breast cancer in her maternal aunt; Cancer in her maternal grandmother; Diabetes in her father; Hypertension in her father and mother. Social History:  reports that she has never smoked. She has never used smokeless tobacco. She reports that she does not drink alcohol or use drugs.     Maternal Diabetes: Yes:  Diabetes Type:  Insulin/Medication controlled Genetic Screening: Normal Maternal Ultrasounds/Referrals: Normal Fetal Ultrasounds or other Referrals:  None Maternal Substance Abuse:  No Significant Maternal Medications:  None Significant Maternal Lab Results:  None Other Comments:  None  ROS History   unknown if currently breastfeeding. Exam Physical Exam  Gen - NAD CV - RRR Abd - gravid, NT Ext - NT, no edema Cvx 2cm Prenatal labs: ABO, Rh: O/Positive/-- (04/02 0000) Antibody: Negative (04/02 0000) Rubella: Immune (04/02 0000) RPR: Nonreactive (04/02 0000)  HBsAg: Negative (04/02 0000)  HIV: Non-reactive (04/02 0000)  GBS:   neg  Assessment/Plan: Admit Pitocin/AROM IOL   Wanda Roberts 03/28/2017, 8:46 AM

## 2017-03-29 ENCOUNTER — Inpatient Hospital Stay (HOSPITAL_COMMUNITY): Payer: 59 | Admitting: Anesthesiology

## 2017-03-29 ENCOUNTER — Encounter (HOSPITAL_COMMUNITY)
Admission: RE | Disposition: A | Discharge status: Home / Self Care | Payer: Self-pay | Source: Ambulatory Visit | Attending: Obstetrics and Gynecology

## 2017-03-29 ENCOUNTER — Encounter (HOSPITAL_COMMUNITY): Payer: Self-pay

## 2017-03-29 ENCOUNTER — Inpatient Hospital Stay (HOSPITAL_COMMUNITY)
Admission: RE | Admit: 2017-03-29 | Discharge: 2017-04-02 | DRG: 788 | Disposition: A | Discharge status: Home / Self Care | Payer: 59 | Source: Ambulatory Visit | Attending: Obstetrics and Gynecology | Admitting: Obstetrics and Gynecology

## 2017-03-29 VITALS — BP 106/65 | HR 89 | Temp 98.2°F | Resp 20 | Ht 59.0 in | Wt 163.0 lb

## 2017-03-29 DIAGNOSIS — O24425 Gestational diabetes mellitus in childbirth, controlled by oral hypoglycemic drugs: Principal | ICD-10-CM | POA: Diagnosis present

## 2017-03-29 DIAGNOSIS — Z98891 History of uterine scar from previous surgery: Secondary | ICD-10-CM

## 2017-03-29 DIAGNOSIS — Z3A38 38 weeks gestation of pregnancy: Secondary | ICD-10-CM

## 2017-03-29 DIAGNOSIS — O24419 Gestational diabetes mellitus in pregnancy, unspecified control: Secondary | ICD-10-CM | POA: Diagnosis present

## 2017-03-29 LAB — CBC
HCT: 35.9 % — ABNORMAL LOW (ref 36.0–46.0)
Hemoglobin: 12.5 g/dL (ref 12.0–15.0)
MCH: 29.8 pg (ref 26.0–34.0)
MCHC: 34.8 g/dL (ref 30.0–36.0)
MCV: 85.5 fL (ref 78.0–100.0)
PLATELETS: 178 10*3/uL (ref 150–400)
RBC: 4.2 MIL/uL (ref 3.87–5.11)
RDW: 15.7 % — AB (ref 11.5–15.5)
WBC: 8.4 10*3/uL (ref 4.0–10.5)

## 2017-03-29 LAB — TYPE AND SCREEN
ABO/RH(D): O POS
Antibody Screen: NEGATIVE

## 2017-03-29 LAB — RPR: RPR: NONREACTIVE

## 2017-03-29 SURGERY — Surgical Case
Anesthesia: Epidural

## 2017-03-29 MED ORDER — METOCLOPRAMIDE HCL 5 MG/ML IJ SOLN
INTRAMUSCULAR | Status: AC
  Filled 2017-03-29: qty 2

## 2017-03-29 MED ORDER — PHENYLEPHRINE 40 MCG/ML (10ML) SYRINGE FOR IV PUSH (FOR BLOOD PRESSURE SUPPORT): 80.0000 ug | PREFILLED_SYRINGE | INTRAVENOUS | Status: DC | PRN

## 2017-03-29 MED ORDER — OXYCODONE-ACETAMINOPHEN 5-325 MG PO TABS: 1.0000 | ORAL_TABLET | ORAL | Status: DC | PRN

## 2017-03-29 MED ORDER — DIPHENHYDRAMINE HCL 50 MG/ML IJ SOLN: 12.5000 mg | INTRAMUSCULAR | Status: DC | PRN

## 2017-03-29 MED ORDER — LACTATED RINGERS IV SOLN: 500.0000 mL | Freq: Once | INTRAVENOUS | Status: DC

## 2017-03-29 MED ORDER — DEXAMETHASONE SODIUM PHOSPHATE 10 MG/ML IJ SOLN
INTRAMUSCULAR | Status: AC
  Filled 2017-03-29: qty 1

## 2017-03-29 MED ORDER — CEFAZOLIN SODIUM-DEXTROSE 2-3 GM-%(50ML) IV SOLR
INTRAVENOUS | Status: AC
  Filled 2017-03-29: qty 50

## 2017-03-29 MED ORDER — FENTANYL 2.5 MCG/ML BUPIVACAINE 1/10 % EPIDURAL INFUSION (WH - ANES)
14.0000 mL/h | INTRAMUSCULAR | Status: DC | PRN
  Administered 2017-03-29 (×2): 14 mL/h via EPIDURAL
  Filled 2017-03-29 (×2): qty 100

## 2017-03-29 MED ORDER — EPHEDRINE 5 MG/ML INJ: 10.0000 mg | INTRAVENOUS | Status: DC | PRN

## 2017-03-29 MED ORDER — OXYTOCIN 40 UNITS IN LACTATED RINGERS INFUSION - SIMPLE MED
1.0000 m[IU]/min | INTRAVENOUS | Status: DC
  Administered 2017-03-29: 2 m[IU]/min via INTRAVENOUS
  Filled 2017-03-29: qty 1000

## 2017-03-29 MED ORDER — LIDOCAINE HCL (PF) 1 % IJ SOLN: 30.0000 mL | INTRAMUSCULAR | Status: DC | PRN

## 2017-03-29 MED ORDER — OXYTOCIN 40 UNITS IN LACTATED RINGERS INFUSION - SIMPLE MED: 2.5000 [IU]/h | INTRAVENOUS | Status: DC

## 2017-03-29 MED ORDER — ONDANSETRON HCL 4 MG/2ML IJ SOLN
4.0000 mg | Freq: Four times a day (QID) | INTRAMUSCULAR | Status: DC | PRN
  Administered 2017-03-29: 4 mg via INTRAVENOUS
  Filled 2017-03-29: qty 2

## 2017-03-29 MED ORDER — LACTATED RINGERS IV SOLN
500.0000 mL | INTRAVENOUS | Status: DC | PRN
  Administered 2017-03-29: 500 mL via INTRAVENOUS

## 2017-03-29 MED ORDER — CEFAZOLIN SODIUM-DEXTROSE 2-4 GM/100ML-% IV SOLN
2.0000 g | Freq: Once | INTRAVENOUS | Status: AC
Start: 2017-03-29 — End: 2017-03-29
  Administered 2017-03-29: 2 g via INTRAVENOUS

## 2017-03-29 MED ORDER — PHENYLEPHRINE 40 MCG/ML (10ML) SYRINGE FOR IV PUSH (FOR BLOOD PRESSURE SUPPORT)
80.0000 ug | PREFILLED_SYRINGE | INTRAVENOUS | Status: DC | PRN
  Filled 2017-03-29 (×2): qty 10

## 2017-03-29 MED ORDER — LIDOCAINE-EPINEPHRINE (PF) 2 %-1:200000 IJ SOLN
INTRAMUSCULAR | Status: DC | PRN
  Administered 2017-03-29: 10 mL via EPIDURAL

## 2017-03-29 MED ORDER — MORPHINE SULFATE (PF) 0.5 MG/ML IJ SOLN
INTRAMUSCULAR | Status: AC
  Filled 2017-03-29: qty 10

## 2017-03-29 MED ORDER — OXYTOCIN 10 UNIT/ML IJ SOLN
INTRAMUSCULAR | Status: AC
  Filled 2017-03-29: qty 4

## 2017-03-29 MED ORDER — SODIUM CHLORIDE 0.9 % IR SOLN
Status: DC | PRN
  Administered 2017-03-29: 1

## 2017-03-29 MED ORDER — DEXAMETHASONE SODIUM PHOSPHATE 10 MG/ML IJ SOLN
INTRAMUSCULAR | Status: DC | PRN
  Administered 2017-03-29: 10 mg via INTRAVENOUS

## 2017-03-29 MED ORDER — DIPHENHYDRAMINE HCL 50 MG/ML IJ SOLN
INTRAMUSCULAR | Status: DC | PRN
  Administered 2017-03-29: 12.5 mg via INTRAVENOUS

## 2017-03-29 MED ORDER — DIPHENHYDRAMINE HCL 50 MG/ML IJ SOLN
INTRAMUSCULAR | Status: AC
  Filled 2017-03-29: qty 1

## 2017-03-29 MED ORDER — MEPERIDINE HCL 25 MG/ML IJ SOLN
INTRAMUSCULAR | Status: AC
  Filled 2017-03-29: qty 1

## 2017-03-29 MED ORDER — LACTATED RINGERS IV SOLN
INTRAVENOUS | Status: DC
  Administered 2017-03-29 (×4): via INTRAVENOUS

## 2017-03-29 MED ORDER — SOD CITRATE-CITRIC ACID 500-334 MG/5ML PO SOLN
30.0000 mL | ORAL | Status: DC | PRN
  Administered 2017-03-29: 30 mL via ORAL
  Filled 2017-03-29: qty 15

## 2017-03-29 MED ORDER — ACETAMINOPHEN 325 MG PO TABS: 650.0000 mg | ORAL_TABLET | ORAL | Status: DC | PRN

## 2017-03-29 MED ORDER — METOCLOPRAMIDE HCL 5 MG/ML IJ SOLN
INTRAMUSCULAR | Status: DC | PRN
  Administered 2017-03-29: 10 mg via INTRAVENOUS

## 2017-03-29 MED ORDER — OXYCODONE-ACETAMINOPHEN 5-325 MG PO TABS: 2.0000 | ORAL_TABLET | ORAL | Status: DC | PRN

## 2017-03-29 MED ORDER — OXYTOCIN BOLUS FROM INFUSION: 500.0000 mL | Freq: Once | INTRAVENOUS | Status: DC

## 2017-03-29 MED ORDER — MORPHINE SULFATE (PF) 0.5 MG/ML IJ SOLN
INTRAMUSCULAR | Status: DC | PRN
  Administered 2017-03-29: 1 mg via INTRAVENOUS
  Administered 2017-03-29: 4 mg via EPIDURAL

## 2017-03-29 MED ORDER — SCOPOLAMINE 1 MG/3DAYS TD PT72
MEDICATED_PATCH | TRANSDERMAL | Status: AC
  Filled 2017-03-29: qty 1

## 2017-03-29 MED ORDER — FENTANYL 2.5 MCG/ML BUPIVACAINE 1/10 % EPIDURAL INFUSION (WH - ANES): 14.0000 mL/h | INTRAMUSCULAR | Status: DC | PRN

## 2017-03-29 MED ORDER — OXYTOCIN 10 UNIT/ML IJ SOLN
INTRAVENOUS | Status: DC | PRN
  Administered 2017-03-29: 40 [IU] via INTRAVENOUS

## 2017-03-29 MED ORDER — ONDANSETRON HCL 4 MG/2ML IJ SOLN
INTRAMUSCULAR | Status: AC
  Filled 2017-03-29: qty 2

## 2017-03-29 MED ORDER — TERBUTALINE SULFATE 1 MG/ML IJ SOLN
0.2500 mg | Freq: Once | INTRAMUSCULAR | Status: DC | PRN
  Filled 2017-03-29: qty 1

## 2017-03-29 MED ORDER — LIDOCAINE HCL (PF) 1 % IJ SOLN
INTRAMUSCULAR | Status: DC | PRN
  Administered 2017-03-29: 4 mL
  Administered 2017-03-29: 6 mL via EPIDURAL

## 2017-03-29 MED ORDER — SCOPOLAMINE 1 MG/3DAYS TD PT72
MEDICATED_PATCH | TRANSDERMAL | Status: DC | PRN
  Administered 2017-03-29: 1 via TRANSDERMAL

## 2017-03-29 MED ORDER — MEPERIDINE HCL 25 MG/ML IJ SOLN
INTRAMUSCULAR | Status: DC | PRN
  Administered 2017-03-29 (×2): 12.5 mg via INTRAVENOUS

## 2017-03-29 MED ORDER — PHENYLEPHRINE 40 MCG/ML (10ML) SYRINGE FOR IV PUSH (FOR BLOOD PRESSURE SUPPORT)
80.0000 ug | PREFILLED_SYRINGE | INTRAVENOUS | Status: DC | PRN
  Administered 2017-03-29 (×2): 80 ug via INTRAVENOUS

## 2017-03-29 MED ORDER — ONDANSETRON HCL 4 MG/2ML IJ SOLN
INTRAMUSCULAR | Status: DC | PRN
  Administered 2017-03-29: 4 mg via INTRAVENOUS

## 2017-03-29 MED ORDER — LACTATED RINGERS IV SOLN
INTRAVENOUS | Status: DC | PRN
  Administered 2017-03-29: 23:00:00 via INTRAVENOUS

## 2017-03-29 MED ORDER — SODIUM BICARBONATE 8.4 % IV SOLN
INTRAVENOUS | Status: AC
Start: 2017-03-29 — End: 2017-03-29
  Filled 2017-03-29: qty 50

## 2017-03-29 SURGICAL SUPPLY — 33 items
CHLORAPREP W/TINT 26ML (MISCELLANEOUS) ×3 IMPLANT
CLAMP CORD UMBIL (MISCELLANEOUS) IMPLANT
CLOTH BEACON ORANGE TIMEOUT ST (SAFETY) ×3 IMPLANT
DERMABOND ADHESIVE PROPEN (GAUZE/BANDAGES/DRESSINGS) ×2
DERMABOND ADVANCED (GAUZE/BANDAGES/DRESSINGS) ×2
DERMABOND ADVANCED .7 DNX12 (GAUZE/BANDAGES/DRESSINGS) ×1 IMPLANT
DERMABOND ADVANCED .7 DNX6 (GAUZE/BANDAGES/DRESSINGS) ×1 IMPLANT
DRSG OPSITE POSTOP 4X10 (GAUZE/BANDAGES/DRESSINGS) ×3 IMPLANT
ELECT REM PT RETURN 9FT ADLT (ELECTROSURGICAL) ×3
ELECTRODE REM PT RTRN 9FT ADLT (ELECTROSURGICAL) ×1 IMPLANT
EXTRACTOR VACUUM M CUP 4 TUBE (SUCTIONS) IMPLANT
EXTRACTOR VACUUM M CUP 4' TUBE (SUCTIONS)
GLOVE BIO SURGEON STRL SZ 6.5 (GLOVE) ×2 IMPLANT
GLOVE BIO SURGEONS STRL SZ 6.5 (GLOVE) ×1
GLOVE BIOGEL PI IND STRL 7.0 (GLOVE) ×2 IMPLANT
GLOVE BIOGEL PI INDICATOR 7.0 (GLOVE) ×4
GOWN STRL REUS W/TWL LRG LVL3 (GOWN DISPOSABLE) ×6 IMPLANT
KIT ABG SYR 3ML LUER SLIP (SYRINGE) IMPLANT
NEEDLE HYPO 25X5/8 SAFETYGLIDE (NEEDLE) IMPLANT
NS IRRIG 1000ML POUR BTL (IV SOLUTION) ×3 IMPLANT
PACK C SECTION WH (CUSTOM PROCEDURE TRAY) ×3 IMPLANT
PAD OB MATERNITY 4.3X12.25 (PERSONAL CARE ITEMS) ×3 IMPLANT
PENCIL SMOKE EVAC W/HOLSTER (ELECTROSURGICAL) ×3 IMPLANT
SPONGE LAP 18X18 X RAY DECT (DISPOSABLE) ×3 IMPLANT
SUT CHROMIC 0 CT 802H (SUTURE) IMPLANT
SUT CHROMIC 0 CTX 36 (SUTURE) ×9 IMPLANT
SUT MON AB-0 CT1 36 (SUTURE) ×3 IMPLANT
SUT PDS AB 0 CTX 60 (SUTURE) ×3 IMPLANT
SUT PLAIN 0 NONE (SUTURE) IMPLANT
SUT VIC AB 4-0 KS 27 (SUTURE) ×3 IMPLANT
SYR BULB 3OZ (MISCELLANEOUS) ×3 IMPLANT
TOWEL OR 17X24 6PK STRL BLUE (TOWEL DISPOSABLE) ×3 IMPLANT
TRAY FOLEY BAG SILVER LF 14FR (SET/KITS/TRAYS/PACK) IMPLANT

## 2017-03-29 NOTE — Progress Notes (Signed)
Pt comfortable w/out complaints  FHT reassuring, occ early decels Toco Q2-3 Cvx unchanged 7/90/0 IUPC placed.  A/P:  Continue pitocin

## 2017-03-29 NOTE — Anesthesia Preprocedure Evaluation (Signed)
Anesthesia Evaluation  Patient identified by MRN, date of birth, ID band Patient awake    Reviewed: Allergy & Precautions, H&P , Patient's Chart, lab work & pertinent test results, reviewed documented beta blocker date and time   Airway Mallampati: II  TM Distance: >3 FB Neck ROM: full    Dental no notable dental hx.    Pulmonary    Pulmonary exam normal breath sounds clear to auscultation       Cardiovascular  Rhythm:regular Rate:Normal     Neuro/Psych    GI/Hepatic   Endo/Other  diabetes, Gestational  Renal/GU      Musculoskeletal   Abdominal   Peds  Hematology   Anesthesia Other Findings   Reproductive/Obstetrics                             Anesthesia Physical Anesthesia Plan  ASA: II  Anesthesia Plan: Epidural   Post-op Pain Management:    Induction:   PONV Risk Score and Plan:   Airway Management Planned:   Additional Equipment:   Intra-op Plan:   Post-operative Plan:   Informed Consent: I have reviewed the patients History and Physical, chart, labs and discussed the procedure including the risks, benefits and alternatives for the proposed anesthesia with the patient or authorized representative who has indicated his/her understanding and acceptance.   Dental Advisory Given  Plan Discussed with: CRNA and Surgeon  Anesthesia Plan Comments: (Labs checked- platelets confirmed with RN in room. Fetal heart tracing, per RN, reported to be stable enough for sitting procedure. Discussed epidural, and patient consents to the procedure:  included risk of possible headache,backache, failed block, allergic reaction, and nerve injury. This patient was asked if she had any questions or concerns before the procedure started.)        Anesthesia Quick Evaluation

## 2017-03-29 NOTE — Transfer of Care (Signed)
Immediate Anesthesia Transfer of Care Note  Patient: Wanda Roberts  Procedure(s) Performed: CESAREAN SECTION (N/A )  Patient Location: PACU  Anesthesia Type:Epidural  Level of Consciousness: awake, alert  and oriented  Airway & Oxygen Therapy: Patient Spontanous Breathing  Post-op Assessment: Report given to RN and Post -op Vital signs reviewed and stable  Post vital signs: Reviewed and stable  Last Vitals:  Vitals:   03/29/17 2205 03/29/17 2210  BP:    Pulse:    Resp:    Temp:    SpO2: 100% 100%    Last Pain:  Vitals:   03/29/17 2001  TempSrc: Oral         Complications: No apparent anesthesia complications

## 2017-03-29 NOTE — Anesthesia Pain Management Evaluation Note (Signed)
  CRNA Pain Management Visit Note  Patient: [name], 31 y.o., female  "Hello I am a [facility] of the anesthesia team [facility]. We have an anesthesia team available at all times to provide care throughout the hospital, including epidural management and anesthesia for C-section. I don't know your plan for the delivery whether it a natural birth, water birth, IV sedation, nitrous supplementation, doula or epidural, but we want to meet your pain goals."   1.Was your pain managed to your expectations on prior hospitalizations?   Yes   2.What is your expectation for pain management during this hospitalization?     Epidural  3.How can we help you reach that goal? epidural  Record the patient's initial score and the patient's pain goal.   Pain: 2  Pain Goal: 6 [facility] wants you to be able to say your pain was always managed very well.  Alphonse Asbridge 03/29/2017

## 2017-03-29 NOTE — Progress Notes (Signed)
Pt comfortable w/ epidural Pitocin was turned off for decel - now on at and FHT reassuring  FHT reassuring Toco Q 2-5 Cvx 5.5cm - no change since last exam  A/P:  Exp mngt, continue pitocin

## 2017-03-29 NOTE — Op Note (Signed)
Cesarean Section Procedure Note   Wanda Roberts  03/29/2017  Indications: fetal intolerance to labor, arrest of dilation   Pre-operative Diagnosis: primary C/S, arrest of dilation.   Post-operative Diagnosis: Same   Surgeon: Moishe SpiceSurgeon(s) and Role:    Zelphia Cairo* Illias Pantano, MD - Primary   Assistants: none  Anesthesia: epidural   Procedure Details:  The patient was seen in the Holding Room. The risks, benefits, complications, treatment options, and expected outcomes were discussed with the patient. The patient concurred with the proposed plan, giving informed consent. identified as Wanda Roberts and the procedure verified as C-Section Delivery. A Time Out was held and the above information confirmed.  After induction of anesthesia, the patient was draped and prepped in the usual sterile manner. A transverse was made and carried down through the subcutaneous tissue to the fascia. Fascial incision was made and extended transversely. The fascia was separated from the underlying rectus tissue superiorly and inferiorly. The peritoneum was identified and entered. Peritoneal incision was extended longitudinally. The utero-vesical peritoneal reflection was incised transversely and the bladder flap was bluntly freed from the lower uterine segment. A low transverse uterine incision was made. Delivered from cephalic presentation was a vigorous female infant.  Cord ph was not sent the umbilical cord was clamped and cut cord blood was obtained for evaluation. The placenta was removed Intact and appeared normal. The uterine outline, tubes and ovaries appeared normal}. The uterine incision was closed with running locked sutures of 0chromic gut.   Hemostasis was observed. Lavage was carried out until clear.  Peritoneum was closed with 0 monocryl.  The fascia was then reapproximated with running sutures of 0PDS. The skin was closed with 4-0Vicryl.   Instrument, sponge, and needle counts were correct prior  the abdominal closure and were correct at the conclusion of the case.    Findings:   Estimated Blood Loss: 889 mL   Urine Output: clear  Specimens: none   Complications: no complications  Disposition: PACU - hemodynamically stable.   Maternal Condition: stable   Baby condition / location:  Couplet care / Skin to Skin  Attending Attestation: I was present and scrubbed for the entire procedure.   Signed: Surgeon(s): Zelphia CairoAdkins, Sabriah Hobbins, MD

## 2017-03-29 NOTE — Anesthesia Procedure Notes (Signed)
Epidural Patient location during procedure: OB  Staffing Anesthesiologist: Brionne Mertz  Preanesthetic Checklist Completed: patient identified, pre-op evaluation, timeout performed, IV checked, risks and benefits discussed and monitors and equipment checked  Epidural Patient position: sitting Prep: DuraPrep Patient monitoring: blood pressure and continuous pulse ox Approach: right paramedian Location: L3-L4 Injection technique: LOR air  Needle:  Needle type: Tuohy  Needle gauge: 17 G Needle insertion depth: 5 cm Catheter type: closed end flexible Catheter size: 19 Gauge Catheter at skin depth: 10 cm Test dose: negative  Assessment Sensory level: T8  Additional Notes    Dosing of Epidural:  1st dose, through catheter .............................................  Xylocaine 40 mg  2nd dose, through catheter, after waiting 3 minutes.........Xylocaine 60 mg    As each dose occurred, patient was free of IV sx; and patient exhibited no evidence of SA injection.  Patient is more comfortable after epidural dosed. Please see RN's note for documentation of vital signs,and FHR which are stable.  Patient reminded not to try to ambulate with numb legs, and that an RN must be present when she attempts to get up.         

## 2017-03-29 NOTE — Progress Notes (Signed)
Dr. Renaldo FiddlerAdkins at bedside to discuss risks and benefits of proceeding with a  Cesarean section for fetal intolerance to labor and arrest of dilation. Consent obtained. Patient prepared for OR  E. Bernard Slayden RN

## 2017-03-29 NOTE — Progress Notes (Signed)
Called to room b/c of 6-8 min FHT deceleration to 60s.  Pitocin turned off, position changes and O2 by facemask given.   Cervix unchanged 7cm FHT now reassuring, cat 1 with pitocin off.  Ctx Q2-3 min  Discussed with patient primary c-section for fetal intolerance to labor and arrest of dilation Discussed risks and benefits including infection, bleeding, transfusion, damage to internal organs that may require further surgery to repair.  All questions answered, informed consent obtained. Ancef ordered

## 2017-03-29 NOTE — Progress Notes (Signed)
Pt comfortable w/ epidural  FHT cat 1 Toco Q2 Cvx 6-7/90/0  A/P:  Continue pitocin Exp mngt

## 2017-03-29 NOTE — Progress Notes (Signed)
Pt denies ctx, vb and lof.  FHT cat 1 Toco none Cvx 2/60/-2 AROM - clear  A/P:  Will start pitocin Exp mngt Epidural prn

## 2017-03-30 ENCOUNTER — Encounter (HOSPITAL_COMMUNITY): Payer: Self-pay | Admitting: Obstetrics and Gynecology

## 2017-03-30 ENCOUNTER — Inpatient Hospital Stay (HOSPITAL_COMMUNITY): Admission: RE | Admit: 2017-03-30 | Payer: Self-pay | Source: Ambulatory Visit

## 2017-03-30 DIAGNOSIS — Z98891 History of uterine scar from previous surgery: Secondary | ICD-10-CM

## 2017-03-30 LAB — CBC
HEMATOCRIT: 34 % — AB (ref 36.0–46.0)
HEMOGLOBIN: 11.6 g/dL — AB (ref 12.0–15.0)
MCH: 28.9 pg (ref 26.0–34.0)
MCHC: 34.1 g/dL (ref 30.0–36.0)
MCV: 84.6 fL (ref 78.0–100.0)
Platelets: 160 10*3/uL (ref 150–400)
RBC: 4.02 MIL/uL (ref 3.87–5.11)
RDW: 15.7 % — AB (ref 11.5–15.5)
WBC: 12.7 10*3/uL — AB (ref 4.0–10.5)

## 2017-03-30 MED ORDER — MEPERIDINE HCL 25 MG/ML IJ SOLN: 6.2500 mg | INTRAMUSCULAR | Status: DC | PRN

## 2017-03-30 MED ORDER — MENTHOL 3 MG MT LOZG: 1.0000 | LOZENGE | OROMUCOSAL | Status: DC | PRN

## 2017-03-30 MED ORDER — SENNOSIDES-DOCUSATE SODIUM 8.6-50 MG PO TABS
2.0000 | ORAL_TABLET | ORAL | Status: DC
  Administered 2017-03-30 – 2017-03-31 (×2): 2 via ORAL
  Filled 2017-03-30 (×3): qty 2

## 2017-03-30 MED ORDER — DIBUCAINE 1 % RE OINT: 1.0000 "application " | TOPICAL_OINTMENT | RECTAL | Status: DC | PRN

## 2017-03-30 MED ORDER — OXYTOCIN 40 UNITS IN LACTATED RINGERS INFUSION - SIMPLE MED: 2.5000 [IU]/h | INTRAVENOUS | Status: AC

## 2017-03-30 MED ORDER — PRENATAL MULTIVITAMIN CH
1.0000 | ORAL_TABLET | Freq: Every day | ORAL | Status: DC
  Administered 2017-03-30 – 2017-04-01 (×3): 1 via ORAL
  Filled 2017-03-30 (×3): qty 1

## 2017-03-30 MED ORDER — SCOPOLAMINE 1 MG/3DAYS TD PT72
1.0000 | MEDICATED_PATCH | Freq: Once | TRANSDERMAL | Status: DC
  Filled 2017-03-30: qty 1

## 2017-03-30 MED ORDER — SODIUM CHLORIDE 0.9% FLUSH: 3.0000 mL | INTRAVENOUS | Status: DC | PRN

## 2017-03-30 MED ORDER — SIMETHICONE 80 MG PO CHEW
80.0000 mg | CHEWABLE_TABLET | ORAL | Status: DC
  Administered 2017-03-30 – 2017-04-01 (×3): 80 mg via ORAL
  Filled 2017-03-30 (×2): qty 1

## 2017-03-30 MED ORDER — DIPHENHYDRAMINE HCL 25 MG PO CAPS: 25.0000 mg | ORAL_CAPSULE | ORAL | Status: DC | PRN

## 2017-03-30 MED ORDER — OXYCODONE-ACETAMINOPHEN 5-325 MG PO TABS
1.0000 | ORAL_TABLET | ORAL | Status: DC | PRN
  Administered 2017-03-30 – 2017-04-02 (×6): 1 via ORAL
  Filled 2017-03-30 (×5): qty 1

## 2017-03-30 MED ORDER — HYDROMORPHONE HCL 1 MG/ML IJ SOLN
0.2500 mg | INTRAMUSCULAR | Status: DC | PRN
  Administered 2017-03-30: 0.5 mg via INTRAVENOUS

## 2017-03-30 MED ORDER — NALBUPHINE HCL 10 MG/ML IJ SOLN: 5.0000 mg | INTRAMUSCULAR | Status: DC | PRN

## 2017-03-30 MED ORDER — DIPHENHYDRAMINE HCL 25 MG PO CAPS: 25.0000 mg | ORAL_CAPSULE | Freq: Four times a day (QID) | ORAL | Status: DC | PRN

## 2017-03-30 MED ORDER — TETANUS-DIPHTH-ACELL PERTUSSIS 5-2.5-18.5 LF-MCG/0.5 IM SUSP: 0.5000 mL | Freq: Once | INTRAMUSCULAR | Status: DC

## 2017-03-30 MED ORDER — NALBUPHINE HCL 10 MG/ML IJ SOLN
5.0000 mg | Freq: Once | INTRAMUSCULAR | Status: DC | PRN
Start: 2017-03-30 — End: 2017-04-02

## 2017-03-30 MED ORDER — OXYCODONE HCL 5 MG PO TABS: 5.0000 mg | ORAL_TABLET | Freq: Once | ORAL | Status: DC | PRN

## 2017-03-30 MED ORDER — DIPHENHYDRAMINE HCL 50 MG/ML IJ SOLN: 12.5000 mg | INTRAMUSCULAR | Status: DC | PRN

## 2017-03-30 MED ORDER — SIMETHICONE 80 MG PO CHEW
80.0000 mg | CHEWABLE_TABLET | ORAL | Status: DC | PRN
  Filled 2017-03-30: qty 1

## 2017-03-30 MED ORDER — DEXTROSE IN LACTATED RINGERS 5 % IV SOLN: INTRAVENOUS | Status: DC

## 2017-03-30 MED ORDER — NALOXONE HCL 2 MG/2ML IJ SOSY
1.0000 ug/kg/h | PREFILLED_SYRINGE | INTRAVENOUS | Status: DC | PRN
  Filled 2017-03-30: qty 2

## 2017-03-30 MED ORDER — NALOXONE HCL 0.4 MG/ML IJ SOLN: 0.4000 mg | INTRAMUSCULAR | Status: DC | PRN

## 2017-03-30 MED ORDER — COCONUT OIL OIL
1.0000 "application " | TOPICAL_OIL | Status: DC | PRN
  Administered 2017-04-02: 1 via TOPICAL
  Filled 2017-03-30: qty 120

## 2017-03-30 MED ORDER — HYDROMORPHONE HCL 1 MG/ML IJ SOLN
INTRAMUSCULAR | Status: AC
  Filled 2017-03-30: qty 0.5

## 2017-03-30 MED ORDER — PROMETHAZINE HCL 25 MG/ML IJ SOLN: 6.2500 mg | INTRAMUSCULAR | Status: DC | PRN

## 2017-03-30 MED ORDER — MEDROXYPROGESTERONE ACETATE 150 MG/ML IM SUSP: 150.0000 mg | INTRAMUSCULAR | Status: DC | PRN

## 2017-03-30 MED ORDER — MEASLES, MUMPS & RUBELLA VAC ~~LOC~~ INJ
0.5000 mL | INJECTION | Freq: Once | SUBCUTANEOUS | Status: DC
  Filled 2017-03-30: qty 0.5

## 2017-03-30 MED ORDER — OXYCODONE-ACETAMINOPHEN 5-325 MG PO TABS
2.0000 | ORAL_TABLET | ORAL | Status: DC | PRN
  Administered 2017-03-30 – 2017-03-31 (×4): 2 via ORAL
  Filled 2017-03-30 (×5): qty 2

## 2017-03-30 MED ORDER — ACETAMINOPHEN 325 MG PO TABS
650.0000 mg | ORAL_TABLET | ORAL | Status: DC | PRN
  Administered 2017-04-02: 650 mg via ORAL
  Filled 2017-03-30 (×2): qty 2

## 2017-03-30 MED ORDER — SIMETHICONE 80 MG PO CHEW
80.0000 mg | CHEWABLE_TABLET | Freq: Three times a day (TID) | ORAL | Status: DC
  Administered 2017-03-30 – 2017-04-02 (×8): 80 mg via ORAL
  Filled 2017-03-30 (×9): qty 1

## 2017-03-30 MED ORDER — WITCH HAZEL-GLYCERIN EX PADS: 1.0000 "application " | MEDICATED_PAD | CUTANEOUS | Status: DC | PRN

## 2017-03-30 MED ORDER — ONDANSETRON HCL 4 MG/2ML IJ SOLN: 4.0000 mg | Freq: Three times a day (TID) | INTRAMUSCULAR | Status: DC | PRN

## 2017-03-30 MED ORDER — OXYCODONE HCL 5 MG/5ML PO SOLN: 5.0000 mg | Freq: Once | ORAL | Status: DC | PRN

## 2017-03-30 MED ORDER — IBUPROFEN 600 MG PO TABS
600.0000 mg | ORAL_TABLET | Freq: Four times a day (QID) | ORAL | Status: DC
  Administered 2017-03-30 – 2017-04-02 (×14): 600 mg via ORAL
  Filled 2017-03-30 (×14): qty 1

## 2017-03-30 NOTE — Anesthesia Postprocedure Evaluation (Signed)
Anesthesia Post Note  Patient: Wanda Roberts  Procedure(s) Performed: CESAREAN SECTION (N/A )     Patient location during evaluation: Mother Baby Anesthesia Type: Epidural Level of consciousness: awake and alert and oriented Pain management: satisfactory to patient Vital Signs Assessment: post-procedure vital signs reviewed and stable Respiratory status: respiratory function stable Cardiovascular status: stable Postop Assessment: no headache, no backache, epidural receding, patient able to bend at knees, no signs of nausea or vomiting and adequate PO intake Anesthetic complications: no    Last Vitals:  Vitals:   03/30/17 0515 03/30/17 0905  BP: 118/66 108/64  Pulse: 75 80  Resp: 18 18  Temp: 37.1 C 37.3 C  SpO2: 98% 99%    Last Pain:  Vitals:   03/30/17 0905  TempSrc: Oral  PainSc: 2    Pain Goal: Patients Stated Pain Goal: 2 (03/30/17 0905)               Karleen DolphinFUSSELL,Jacqulynn Shappell

## 2017-03-30 NOTE — Addendum Note (Signed)
Addendum  created 03/30/17 0917 by Graciela HusbandsFussell, Khylon Davies O, CRNA   Charge Capture section accepted, Sign clinical note

## 2017-03-30 NOTE — Anesthesia Postprocedure Evaluation (Signed)
Anesthesia Post Note  Patient: Wanda Roberts  Procedure(s) Performed: CESAREAN SECTION (N/A )     Patient location during evaluation: PACU Anesthesia Type: Epidural Level of consciousness: awake and alert Pain management: pain level controlled Vital Signs Assessment: post-procedure vital signs reviewed and stable Respiratory status: spontaneous breathing, nonlabored ventilation and respiratory function stable Cardiovascular status: stable and blood pressure returned to baseline Postop Assessment: no apparent nausea or vomiting Anesthetic complications: no    Last Vitals:  Vitals:   03/30/17 0056 03/30/17 0110  BP:  112/62  Pulse: 93 79  Resp: 20 16  Temp:  36.9 C  SpO2: 99% 98%    Last Pain:  Vitals:   03/30/17 0110  TempSrc: Oral  PainSc: 3    Pain Goal:                 Lowella CurbWarren Ray Miller

## 2017-03-30 NOTE — Lactation Note (Signed)
This note was copied from a baby's chart. Lactation Consultation Note  Patient Name: Wanda Levy SjogrenDivya Kallal Today's Date: 03/30/2017 Reason for consult: Initial assessment;Maternal endocrine disorder Type of Endocrine Disorder?: PCOS  Baby 13 hours old. Mom reports that she is having trouble latching baby. Assisted mom to latch baby in football position to right breast. Mom able to hand express with colostrum flowing bilaterally. Baby would suckle this LC's gloved finger, but would not suckle at mom's breast. Fitted mom with #20 NS and baby latched deeply and maintained a deep latch with colostrum present in the NS. Mom reports comfort and her nipple everted well in the NS. Enc mom to continue putting baby to breast with cues, and use NS as needed. Discussed the need to start using DEBP if baby continues to need NS.   [facility], aware of OP/BFSG and LC phone line assistance after D/C.   Maternal Data    Feeding Feeding Type: Breast Fed Length of feed: 10 min  LATCH Score Latch: Repeated attempts needed to sustain latch, nipple held in mouth throughout feeding, stimulation needed to elicit sucking reflex.  Audible Swallowing: None  Type of Nipple: Inverted  Comfort (Breast/Nipple): Soft / non-tender  Hold (Positioning): Assistance needed to correctly position infant at breast and maintain latch.  LATCH Score: 4  Interventions Interventions: Breast feeding basics reviewed;Assisted with latch;Skin to skin;Hand express;Pre-pump if needed;Breast compression;Adjust position;Support pillows;Position options;Expressed milk  Lactation Tools Discussed/Used Tools: Shells;Pump Breast pump type: Manual Pump Review: Setup, frequency, and cleaning Initiated by:: Bedside RN. Date initiated:: 03/30/17   Consult Status Consult Status: Follow-up Date: 03/31/17 Follow-up type: In-patient    Sherlyn HayJennifer D Breyton Vanscyoc 03/30/2017, 12:47 PM

## 2017-03-30 NOTE — Progress Notes (Signed)
Subjective: Postpartum Day 1: Cesarean Delivery Patient reports incisional pain and tolerating PO.    Objective: Vital signs in last 24 hours: Temp:  [97.6 F (36.4 C)-99 F (37.2 C)] 98.7 F (37.1 C) (11/03 0515) Pulse Rate:  [59-115] 75 (11/03 0515) Resp:  [14-24] 18 (11/03 0515) BP: (65-138)/(29-81) 118/66 (11/03 0515) SpO2:  [97 %-100 %] 98 % (11/03 0515) Weight:  [163 lb (73.9 kg)] 163 lb (73.9 kg) (11/02 0817)  Physical Exam:  General: alert and cooperative Lochia: appropriate Uterine Fundus: firm Incision: healing well, no significant drainage DVT Evaluation: No evidence of DVT seen on physical exam.   Recent Labs  03/29/17 0754 03/30/17 0345  HGB 12.5 11.6*  HCT 35.9* 34.0*    Assessment/Plan: Status post Cesarean section. Doing well postoperatively.  Continue current care.  Laloni Rowton 03/30/2017, 7:12 AM

## 2017-03-31 NOTE — Lactation Note (Signed)
This note was copied from a baby's chart. Lactation Consultation Note Called into rm. D/t difficult painful latch. Baby's lips looked dry. Oral mucosa moist. Assessed baby's suck, doesn't extend tongue past gums.  Mom has flat nipples w/inverted center crease. Small breast slightly filling (mom had c/section, probable edema). Breast massage taught, hand expression taught w/1 ml colostrum collected. Mom states has nipple cracks and bleeding. Appears intact, no scabs or bleeding noted at this time. Colostrum clear.  Mom c/o latching hurting. Has #16 NS. Applied, latched baby in football position. Flanged lips, c/o pain, breast compression, "C" hold taught, cheeks to breast, cont. To have pain. Applied #20 NS, states some better still has pain. No bleeding in NS. Breast not compressible for latch w/o NS. Discussed this w/mom.  Colostrum applied to NS w/spoon baby start feeding when got sleepy. Discussed and placed support for mom's hand. Discussed comfort and positioning during BF.  Shells given to wear in bra to evert flat nipples, hand pump given to pre-pump to evert nipples before application of NS.  Mom appears to be well educated of tools and BF. Speaks good English. Reported to RN. Patient Name: Wanda Roberts Today's Date: 03/31/2017 Reason for consult: Mother's request;Nipple pain/trauma;Difficult latch   Maternal Data    Feeding Feeding Type: Breast Fed Length of feed: 15 min  LATCH Score Latch: Repeated attempts needed to sustain latch, nipple held in mouth throughout feeding, stimulation needed to elicit sucking reflex.  Audible Swallowing: A few with stimulation  Type of Nipple: Flat  Comfort (Breast/Nipple): Filling, red/small blisters or bruises, mild/mod discomfort  Hold (Positioning): Assistance needed to correctly position infant at breast and maintain latch.  LATCH Score: 5  Interventions Interventions: Breast feeding basics reviewed;Assisted with latch;Breast  compression;Shells;Skin to skin;Adjust position;Breast massage;Support pillows;Hand pump;Hand express;Position options;Pre-pump if needed;Expressed milk  Lactation Tools Discussed/Used Tools: Shells;Pump;Nipple Shields Nipple shield size: 20;16 Shell Type: Inverted Breast pump type: Manual   Consult Status Consult Status: Follow-up Date: 03/31/17 Follow-up type: In-patient    Charyl DancerCARVER, Juddson Cobern G 03/31/2017, 3:54 AM

## 2017-03-31 NOTE — Progress Notes (Signed)
Subjective: Postpartum Day 2: Cesarean Delivery Patient reports incisional pain, tolerating PO, + flatus and no problems voiding.    Objective: Vital signs in last 24 hours: Temp:  [97.9 F (36.6 C)-99.3 F (37.4 C)] 98.5 F (36.9 C) (11/04 0538) Pulse Rate:  [81-117] 117 (11/04 0538) Resp:  [18-20] 20 (11/04 0538) BP: (97-108)/(49-74) 100/74 (11/04 0538) SpO2:  [97 %-99 %] 99 % (11/04 0538)  Physical Exam:  General: alert and cooperative Lochia: appropriate Uterine Fundus: firm Incision: healing well, no significant drainage DVT Evaluation: No evidence of DVT seen on physical exam.  Recent Labs    03/29/17 0754 03/30/17 0345  HGB 12.5 11.6*  HCT 35.9* 34.0*    Assessment/Plan: Status post Cesarean section. Doing well postoperatively.  Continue current care.  Juanangel Soderholm 03/31/2017, 9:45 AM

## 2017-03-31 NOTE — Plan of Care (Signed)
Ambulating well within room. Pain controlled with motrin and percocet.

## 2017-04-01 LAB — GLUCOSE, RANDOM: GLUCOSE: 90 mg/dL (ref 65–99)

## 2017-04-01 LAB — GLUCOSE, CAPILLARY: GLUCOSE-CAPILLARY: 104 mg/dL — AB (ref 65–99)

## 2017-04-01 MED ORDER — POLYETHYLENE GLYCOL 3350 17 G PO PACK
17.0000 g | PACK | Freq: Every day | ORAL | Status: DC | PRN
  Administered 2017-04-01: 17 g via ORAL
  Filled 2017-04-01 (×2): qty 1

## 2017-04-01 NOTE — Progress Notes (Signed)
Subjective: Postpartum Day 3: Cesarean Delivery Patient reports tolerating PO, + flatus and no problems voiding.   Baby on bili lights and wants to stay until tomorrow  Objective: Vital signs in last 24 hours: Temp:  [98 F (36.7 C)-98.2 F (36.8 C)] 98.2 F (36.8 C) (11/05 0605) Pulse Rate:  [88-98] 88 (11/05 0605) Resp:  [18] 18 (11/05 0605) BP: (115)/(69) 115/69 (11/04 1739)  Physical Exam:  General: alert, cooperative, appears stated age and no distress Lochia: appropriate Uterine Fundus: firm Incision: healing well DVT Evaluation: No evidence of DVT seen on physical exam.  Recent Labs    03/30/17 0345  HGB 11.6*  HCT 34.0*    Assessment/Plan: Status post Cesarean section. Doing well postoperatively.  Continue current care.  Kerryn Tennant C 04/01/2017, 9:29 AM

## 2017-04-01 NOTE — Lactation Note (Signed)
This note was copied from a baby's chart. Lactation Consultation Note  Patient Name: Wanda Roberts Today's Date: 04/01/2017 Reason for consult: Follow-up assessment;Nipple pain/trauma;Infant weight loss;Infant < 6lbs;Hyperbilirubinemia;Mother's request Type of Endocrine Disorder?: PCOS(GDM/Glyburide)   Follow up with mom of 61 hour old infant. Infant with 9 BF for 10-40 minutes, EBM x 2 5-10 cc via spoon, formula x 8 via bottle of 12-22 cc, 4 voids and 3 stools in the last 24 hours. LATCH scores 7. Infant weight 5 lb 12.6 oz with weight loss of 7% since birth. Infant started under single phototherapy.   Mom and dad requested assistance to figure out feeding plan. Mom has been offering breast on demand using the # 20 NS, mom reports the feedings are painful and pinching. Mom had infant latched to right breast in the football hold, infant with minimal feeding effort. Worked with mom to keep infant closer to the breast during feeding. Changed mom to # 24 NS due to pain/pinching with latching. Mom reported increased comfort but still tender. Infant was noted to have a few swallows. Infant fed intermittently for about 20 minutes and then released the breast, nipple was elongated and not compressed. There was colostrum noted in the NS. Face of mom's nipples are pink. Mom is using comfort gels to her nipples. Mom's breasts are slightly cone shaped and compressible. Reviewed with mom that if she needs to rest nipples due to pain it is ok to pump 1-2 feedings and then try again at the breast.   Mom is pumping every 3-4 hours post BF milk volume is increasing with each pumping. Parents note infant is sleepy, they are offering pumped EBM in a spoon prior to latch and that is helping infant latch. They are then BF and then offering infant formula in a bottle. Reviewed supplementation amounts with parents based on day of age and whether she has BF or not. Discussed that due to jaundice and weight, to awaken  infant to feed at least every 3 hours and with feeding cues. Discussed limiting feeding time to 30 minutes with BF and bottle feeding combined to conserve calories.    Parents are aware to keep infant under phototherapy as much as possible, parents report they are not able to keep on infant when feeding on the right breast as it will not reach. Dad put infant back on lights as soon as infant finished feeding and then fed infant formula while on lights. Praised parents for their hard work. Enc mom to call out for feeding assistance as needed.   Reviewed feeding plan and wrote on board, parents voiced understanding.    Breast feed infant at the breast using # 24 NS at least every 3 hours for no longer than 20 minutes Awaken infant as needed to feed Offer infant supplement prior to latch to assist with awakening as needed Supplement infant with EBM/formula after BF with bottle (parents choice) based on supplementation guidelines Pump for 15 minutes on Initiate setting after BF Hand Express post pumping  Parents interested in follow up OP appt. Message sent to [facility] to call mom and schedule OP appt either Friday of this week or Monday of next week.         Maternal Data Formula Feeding for Exclusion: No Has patient been taught Hand Expression?: Yes Does the patient have breastfeeding experience prior to this delivery?: No  Feeding Feeding Type: Breast Fed Length of feed: 15 min  LATCH Score Latch: Repeated attempts needed  to sustain latch, nipple held in mouth throughout feeding, stimulation needed to elicit sucking reflex.  Audible Swallowing: A few with stimulation  Type of Nipple: Everted at rest and after stimulation  Comfort (Breast/Nipple): Filling, red/small blisters or bruises, mild/mod discomfort  Hold (Positioning): Assistance needed to correctly position infant at breast and maintain latch.  LATCH Score: 6  Interventions Interventions: Breast feeding basics  reviewed;Support pillows;Assisted with latch;Position options;Skin to skin;Expressed milk;Hand express;Pre-pump if needed;Breast compression;Comfort gels;DEBP;Hand pump;Adjust position  Lactation Tools Discussed/Used Tools: Shells;Pump;Nipple Shields Nipple shield size: 20;24 Breast pump type: Double-Electric Breast Pump;Manual Pump Review: Setup, frequency, and cleaning;Milk Storage Initiated by:: Bedside RN, reviewed and encouraged every 2-3 hours post BF   Consult Status Consult Status: Follow-up Date: 04/02/17 Follow-up type: In-patient    Wanda Roberts 04/01/2017, 12:33 PM

## 2017-04-02 ENCOUNTER — Encounter (HOSPITAL_COMMUNITY): Payer: Self-pay

## 2017-04-02 MED ORDER — ACETAMINOPHEN 325 MG PO TABS: 650.0000 mg | ORAL_TABLET | ORAL | 0 refills | Status: AC | PRN

## 2017-04-02 MED ORDER — IBUPROFEN 600 MG PO TABS: 600.0000 mg | ORAL_TABLET | Freq: Four times a day (QID) | ORAL | 0 refills | Status: AC | PRN

## 2017-04-02 MED ORDER — OXYCODONE HCL 5 MG PO TABS: 5.0000 mg | ORAL_TABLET | Freq: Four times a day (QID) | ORAL | 0 refills | Status: DC | PRN

## 2017-04-02 NOTE — Lactation Note (Signed)
This note was copied from a baby's chart. Lactation Consultation Note: Mother reports that infant just finished feeding from both breast. Mother feels infant is still hungry. Assist mother with latching infant on the Lt breast with and without the nipple shield. Infant sustained latch without the shield for 15-20 mins. Observed frequent suckles and swallows. Mother reports only slight discomfort. Mother taught to flange infants lips for wider gape and tug on infant lower jaw. Mother taught to do breast compression.  Discussed using a wide base bottle nipple and taught parents to pace bottle feed.  Lots of teaching on proper support and good positioning. Mother advised in proper application of the nipple shield. Discussed that if mother continues to use the nipple shield. She will need to post pump until milk comes to volume and then several times daily to protect milk supply. Mother has Spectra and Medela at home. Mother advised in treatment and prevention of engorgement. Encouraged mother to do good breast massage and use ice as needed to decrease swelling.  [facility] to get a call for a outpatient follow up and feeding assessment. Mother receptive to all teaching. Mother to continue to cue base feed infant and then supplement with ebm and formula.Mother is aware of available LC services , [facility] that has [facility] number for questions or concerns about breastfeeding.   Patient Name: Wanda Roberts Today's Date: 04/02/2017 Reason for consult: Follow-up assessment   Maternal Data    Feeding Feeding Type: Formula Length of feed: 20 min(observed frequent suckling and swallows)  LATCH Score Latch: Grasps breast easily, tongue down, lips flanged, rhythmical sucking.  Audible Swallowing: Spontaneous and intermittent  Type of Nipple: Everted at rest and after stimulation  Comfort (Breast/Nipple): Filling, red/small blisters or bruises, mild/mod  discomfort  Hold (Positioning): Assistance needed to correctly position infant at breast and maintain latch.  LATCH Score: 8  Interventions    Lactation Tools Discussed/Used     Consult Status Consult Status: Follow-up Date: (office to call mother for outpatient appt.) Follow-up type: Out-patient    Stevan BornKendrick, Brenon Antosh [facility] 04/02/2017, 10:42 AM

## 2017-04-02 NOTE — Discharge Summary (Signed)
Obstetric Discharge Summary Reason for Admission: induction of labor Prenatal Procedures: none Intrapartum Procedures: cesarean: low cervical, transverse Postpartum Procedures: none Complications-Operative and Postpartum: none Hemoglobin  Date Value Ref Range Status  03/30/2017 11.6 (L) 12.0 - 15.0 g/dL Final   HCT  Date Value Ref Range Status  03/30/2017 34.0 (L) 36.0 - 46.0 % Final    Physical Exam:  General: alert, cooperative and no distress Lochia: appropriate Uterine Fundus: firm Incision: healing well DVT Evaluation: No evidence of DVT seen on physical exam.  Discharge Diagnoses: Term Pregnancy-delivered  Discharge Information: Date: 04/02/2017 Activity: pelvic rest Diet: routine Medications: PNV, Ibuprofen and oxycodone Condition: stable Instructions: refer to practice specific booklet Discharge to: home   Newborn Data: Live born female  Birth Weight: 6 lb 4 oz (2835 g) APGAR: ,   Newborn Delivery   Birth date/time:  03[birth year]2017 23:08:00 Delivery type:  C-Section, Low Transverse C-section categorization:  Primary     Home with mother.  Wanda Roberts,Wanda Roberts 04/02/2017, 8:24 AM

## 2017-04-07 ENCOUNTER — Inpatient Hospital Stay (HOSPITAL_COMMUNITY): Admission: AD | Admit: 2017-04-07 | Payer: 59 | Source: Ambulatory Visit | Admitting: Obstetrics and Gynecology

## 2017-04-12 ENCOUNTER — Encounter (HOSPITAL_COMMUNITY): Payer: Self-pay

## 2019-06-11 ENCOUNTER — Ambulatory Visit: Payer: 59 | Attending: Internal Medicine

## 2019-06-11 DIAGNOSIS — Z20822 Contact with and (suspected) exposure to covid-19: Secondary | ICD-10-CM | POA: Insufficient documentation

## 2019-06-12 LAB — NOVEL CORONAVIRUS, NAA: SARS-CoV-2, NAA: NOT DETECTED

## 2019-07-09 ENCOUNTER — Other Ambulatory Visit: Payer: 59

## 2019-07-10 ENCOUNTER — Ambulatory Visit: Payer: 59 | Attending: Internal Medicine

## 2019-07-10 DIAGNOSIS — Z20822 Contact with and (suspected) exposure to covid-19: Secondary | ICD-10-CM | POA: Insufficient documentation

## 2019-07-11 LAB — NOVEL CORONAVIRUS, NAA: SARS-CoV-2, NAA: NOT DETECTED

## 2020-10-20 ENCOUNTER — Other Ambulatory Visit: Payer: Self-pay

## 2020-10-20 ENCOUNTER — Encounter (HOSPITAL_COMMUNITY): Payer: Self-pay | Admitting: Obstetrics and Gynecology

## 2020-10-20 NOTE — Progress Notes (Signed)
PCP - Dr. Chanetta Marshall Cardiologist -  OBGYN: Dr. Vincente Poli - for pregnancy  Chest x-ray -  EKG - DOS Stress Test -  ECHO -  Cardiac Cath -   Fasting Blood Sugar: < 150 per pt  Checks Blood Sugar:  Pt rarely checks CBG at home    COVID TEST- n/a  Anesthesia review: n/a  -------------  SDW INSTRUCTIONS:  Your procedure is scheduled on 5/27. Please report to [facility] Main Entrance "A" at 05:30 A.M., and check in at the Admitting office. Call this number if you have problems the morning of surgery: [phone]   Remember: Do not eat after midnight the night before your surgery  You may drink clear liquids until 04:30 AM the morning of your surgery.   Clear liquids allowed are: Water, Non-Citrus Juices (without pulp), Carbonated Beverages, Clear Tea, Black Coffee Only, and Gatorade   Medications to take morning of surgery with a sip of water include: none  As of today, STOP taking any Aspirin (unless otherwise instructed by your surgeon), Aleve, Naproxen, Ibuprofen, Motrin, Advil, Goody's, BC's, all herbal medications, fish oil, and all vitamins.    The Morning of Surgery Do not wear jewelry, make-up or nail polish. Do not wear lotions, powders, or perfumes, or deodorant Men may shave face and neck. Do not bring valuables to the hospital. [facility] is not responsible for any belongings or valuables. If you are a smoker, DO NOT Smoke 24 hours prior to surgery If you wear a CPAP at night please bring your mask the morning of surgery  Remember that you must have someone to transport you home after your surgery, and remain with you for 24 hours if you are discharged the same day. Please bring cases for contacts, glasses, hearing aids, dentures or bridgework because it cannot be worn into surgery.   Patients discharged the day of surgery will not be allowed to drive home.   Please shower the NIGHT BEFORE SURGERY and the MORNING OF SURGERY with DIAL Soap. Wear comfortable  clothes the morning of surgery. Oral Hygiene is also important to reduce your risk of infection.  Remember - BRUSH YOUR TEETH THE MORNING OF SURGERY WITH YOUR REGULAR TOOTHPASTE  Patient denies shortness of breath, fever, cough and chest pain.

## 2020-10-20 NOTE — Anesthesia Preprocedure Evaluation (Addendum)
Anesthesia Evaluation  Patient identified by MRN, date of birth, ID band Patient awake    Reviewed: Allergy & Precautions, NPO status , Patient's Chart, lab work & pertinent test results  Airway Mallampati: I  TM Distance: >3 FB Neck ROM: Full    Dental  (+) Teeth Intact   Pulmonary neg pulmonary ROS,    Pulmonary exam normal        Cardiovascular negative cardio ROS   Rhythm:Regular Rate:Normal     Neuro/Psych negative neurological ROS  negative psych ROS   GI/Hepatic negative GI ROS, Neg liver ROS,   Endo/Other  diabetes  Renal/GU   Female GU complaint     Musculoskeletal negative musculoskeletal ROS (+)   Abdominal (+)  Abdomen: soft. Bowel sounds: normal.  Peds  Hematology  (+) anemia ,   Anesthesia Other Findings   Reproductive/Obstetrics miscarriage                             Anesthesia Physical Anesthesia Plan  ASA: II  Anesthesia Plan: General   Post-op Pain Management:    Induction: Intravenous  PONV Risk Score and Plan: 3 and Ondansetron, Dexamethasone, Midazolam and Treatment may vary due to age or medical condition  Airway Management Planned: Mask and LMA  Additional Equipment: None  Intra-op Plan:   Post-operative Plan: Extubation in OR  Informed Consent: I have reviewed the patients History and Physical, chart, labs and discussed the procedure including the risks, benefits and alternatives for the proposed anesthesia with the patient or authorized representative who has indicated his/her understanding and acceptance.     Dental advisory given  Plan Discussed with: CRNA  Anesthesia Plan Comments: (Lab Results      Component                Value               Date                      WBC                      7.2                 10/21/2020                HGB                      12.7                10/21/2020                HCT                       37.1                10/21/2020                MCV                      87.7                10/21/2020                PLT                      174  10/21/2020           Lab Results      Component                Value               Date                      NA                       134 (L)             10/21/2020                K                        4.1                 10/21/2020                CO2                      22                  10/21/2020                GLUCOSE                  104 (H)             10/21/2020                BUN                      7                   10/21/2020                CREATININE               0.61                10/21/2020                CALCIUM                  9.0                 10/21/2020                GFRNONAA                 >60                 10/21/2020          )       Anesthesia Quick Evaluation

## 2020-10-21 ENCOUNTER — Ambulatory Visit (HOSPITAL_COMMUNITY): Payer: 59 | Admitting: Anesthesiology

## 2020-10-21 ENCOUNTER — Encounter (HOSPITAL_COMMUNITY): Payer: Self-pay | Admitting: Obstetrics and Gynecology

## 2020-10-21 ENCOUNTER — Ambulatory Visit (HOSPITAL_COMMUNITY)
Admission: RE | Admit: 2020-10-21 | Discharge: 2020-10-21 | Disposition: A | Discharge status: Home / Self Care | Payer: 59 | Attending: Obstetrics and Gynecology | Admitting: Obstetrics and Gynecology

## 2020-10-21 ENCOUNTER — Other Ambulatory Visit: Payer: Self-pay

## 2020-10-21 ENCOUNTER — Ambulatory Visit (HOSPITAL_COMMUNITY): Payer: 59

## 2020-10-21 ENCOUNTER — Encounter (HOSPITAL_COMMUNITY)
Admission: RE | Disposition: A | Discharge status: Home / Self Care | Payer: Self-pay | Source: Home / Self Care | Attending: Obstetrics and Gynecology

## 2020-10-21 DIAGNOSIS — O021 Missed abortion: Secondary | ICD-10-CM | POA: Diagnosis present

## 2020-10-21 DIAGNOSIS — Z79899 Other long term (current) drug therapy: Secondary | ICD-10-CM | POA: Insufficient documentation

## 2020-10-21 DIAGNOSIS — Z7984 Long term (current) use of oral hypoglycemic drugs: Secondary | ICD-10-CM | POA: Diagnosis not present

## 2020-10-21 DIAGNOSIS — Z3A11 11 weeks gestation of pregnancy: Secondary | ICD-10-CM | POA: Diagnosis not present

## 2020-10-21 DIAGNOSIS — Z419 Encounter for procedure for purposes other than remedying health state, unspecified: Secondary | ICD-10-CM

## 2020-10-21 HISTORY — PX: OPERATIVE ULTRASOUND: SHX5996

## 2020-10-21 HISTORY — PX: DILATION AND EVACUATION: SHX1459

## 2020-10-21 LAB — BASIC METABOLIC PANEL
Anion gap: 6 (ref 5–15)
BUN: 7 mg/dL (ref 6–20)
CO2: 22 mmol/L (ref 22–32)
Calcium: 9 mg/dL (ref 8.9–10.3)
Chloride: 106 mmol/L (ref 98–111)
Creatinine, Ser: 0.61 mg/dL (ref 0.44–1.00)
GFR, Estimated: >60 mL/min (ref >60)
Glucose, Bld: 104 mg/dL — ABNORMAL HIGH (ref 70–99)
Potassium: 4.1 mmol/L (ref 3.5–5.1)
Sodium: 134 mmol/L — ABNORMAL LOW (ref 135–145)

## 2020-10-21 LAB — TYPE AND SCREEN
ABO/RH(D): O POS
Antibody Screen: NEGATIVE

## 2020-10-21 LAB — CBC
HCT: 37.1 % (ref 36.0–46.0)
Hemoglobin: 12.7 g/dL (ref 12.0–15.0)
MCH: 30 pg (ref 26.0–34.0)
MCHC: 34.2 g/dL (ref 30.0–36.0)
MCV: 87.7 fL (ref 80.0–100.0)
Platelets: 174 10*3/uL (ref 150–400)
RBC: 4.23 MIL/uL (ref 3.87–5.11)
RDW: 13.3 % (ref 11.5–15.5)
WBC: 7.2 10*3/uL (ref 4.0–10.5)
nRBC: 0 % (ref 0.0–0.2)

## 2020-10-21 LAB — RPR: RPR Ser Ql: NONREACTIVE

## 2020-10-21 LAB — GLUCOSE, CAPILLARY
Glucose-Capillary: 102 mg/dL — ABNORMAL HIGH (ref 70–99)
Glucose-Capillary: 88 mg/dL (ref 70–99)

## 2020-10-21 SURGERY — DILATION AND EVACUATION, UTERUS
Anesthesia: General | Site: Vagina

## 2020-10-21 MED ORDER — DEXAMETHASONE SODIUM PHOSPHATE 10 MG/ML IJ SOLN
INTRAMUSCULAR | Status: AC
  Filled 2020-10-21: qty 1

## 2020-10-21 MED ORDER — ONDANSETRON HCL 4 MG/2ML IJ SOLN
INTRAMUSCULAR | Status: AC
  Filled 2020-10-21: qty 2

## 2020-10-21 MED ORDER — PROPOFOL 10 MG/ML IV BOLUS
INTRAVENOUS | Status: AC
  Filled 2020-10-21: qty 20

## 2020-10-21 MED ORDER — FENTANYL CITRATE (PF) 250 MCG/5ML IJ SOLN
INTRAMUSCULAR | Status: DC | PRN
  Administered 2020-10-21 (×3): 25 ug via INTRAVENOUS

## 2020-10-21 MED ORDER — MIDAZOLAM HCL 2 MG/2ML IJ SOLN
INTRAMUSCULAR | Status: AC
  Filled 2020-10-21: qty 2

## 2020-10-21 MED ORDER — CHLORHEXIDINE GLUCONATE 0.12 % MT SOLN
15.0000 mL | Freq: Once | OROMUCOSAL | Status: AC
  Administered 2020-10-21: 15 mL via OROMUCOSAL
  Filled 2020-10-21: qty 15

## 2020-10-21 MED ORDER — PROPOFOL 10 MG/ML IV BOLUS
INTRAVENOUS | Status: DC | PRN
  Administered 2020-10-21: 140 mg via INTRAVENOUS

## 2020-10-21 MED ORDER — ORAL CARE MOUTH RINSE: 15.0000 mL | Freq: Once | OROMUCOSAL | Status: AC

## 2020-10-21 MED ORDER — FENTANYL CITRATE (PF) 250 MCG/5ML IJ SOLN
INTRAMUSCULAR | Status: AC
  Filled 2020-10-21: qty 5

## 2020-10-21 MED ORDER — LACTATED RINGERS IV SOLN: INTRAVENOUS | Status: DC

## 2020-10-21 MED ORDER — LACTATED RINGERS IV SOLN: INTRAVENOUS | Status: DC | PRN

## 2020-10-21 MED ORDER — LIDOCAINE 2% (20 MG/ML) 5 ML SYRINGE
INTRAMUSCULAR | Status: AC
  Filled 2020-10-21: qty 5

## 2020-10-21 MED ORDER — METHYLERGONOVINE MALEATE 0.2 MG/ML IJ SOLN
INTRAMUSCULAR | Status: AC
  Filled 2020-10-21: qty 1

## 2020-10-21 MED ORDER — LIDOCAINE 2% (20 MG/ML) 5 ML SYRINGE
INTRAMUSCULAR | Status: DC | PRN
  Administered 2020-10-21: 60 mg via INTRAVENOUS

## 2020-10-21 MED ORDER — SODIUM CHLORIDE 0.9 % IV SOLN
2.0000 g | INTRAVENOUS | Status: AC
  Administered 2020-10-21: 2 g via INTRAVENOUS
  Filled 2020-10-21: qty 2

## 2020-10-21 MED ORDER — MEPERIDINE HCL 25 MG/ML IJ SOLN
25.0000 mg | Freq: Once | INTRAMUSCULAR | Status: AC
  Administered 2020-10-21: 25 mg via INTRAVENOUS

## 2020-10-21 MED ORDER — EPHEDRINE 5 MG/ML INJ
INTRAVENOUS | Status: AC
  Filled 2020-10-21: qty 10

## 2020-10-21 MED ORDER — METHYLERGONOVINE MALEATE 0.2 MG/ML IJ SOLN
INTRAMUSCULAR | Status: DC | PRN
  Administered 2020-10-21: .2 mg via INTRAMUSCULAR

## 2020-10-21 MED ORDER — ONDANSETRON HCL 4 MG/2ML IJ SOLN
INTRAMUSCULAR | Status: DC | PRN
  Administered 2020-10-21: 4 mg via INTRAVENOUS

## 2020-10-21 MED ORDER — PHENYLEPHRINE 40 MCG/ML (10ML) SYRINGE FOR IV PUSH (FOR BLOOD PRESSURE SUPPORT)
PREFILLED_SYRINGE | INTRAVENOUS | Status: AC
  Filled 2020-10-21: qty 10

## 2020-10-21 MED ORDER — MISOPROSTOL 200 MCG PO TABS
200.0000 ug | ORAL_TABLET | ORAL | Status: AC
  Administered 2020-10-21: 200 ug via ORAL
  Filled 2020-10-21: qty 1

## 2020-10-21 MED ORDER — EPHEDRINE SULFATE-NACL 50-0.9 MG/10ML-% IV SOSY
PREFILLED_SYRINGE | INTRAVENOUS | Status: DC | PRN
  Administered 2020-10-21: 10 mg via INTRAVENOUS
  Administered 2020-10-21 (×2): 5 mg via INTRAVENOUS

## 2020-10-21 MED ORDER — DEXAMETHASONE SODIUM PHOSPHATE 10 MG/ML IJ SOLN
INTRAMUSCULAR | Status: DC | PRN
  Administered 2020-10-21: 10 mg via INTRAVENOUS

## 2020-10-21 MED ORDER — LIDOCAINE HCL 1 % IJ SOLN
INTRAMUSCULAR | Status: AC
  Filled 2020-10-21: qty 20

## 2020-10-21 MED ORDER — 0.9 % SODIUM CHLORIDE (POUR BTL) OPTIME
TOPICAL | Status: DC | PRN
  Administered 2020-10-21: 1000 mL

## 2020-10-21 MED ORDER — PHENYLEPHRINE 40 MCG/ML (10ML) SYRINGE FOR IV PUSH (FOR BLOOD PRESSURE SUPPORT)
PREFILLED_SYRINGE | INTRAVENOUS | Status: DC | PRN
  Administered 2020-10-21 (×2): 40 ug via INTRAVENOUS

## 2020-10-21 MED ORDER — MEPERIDINE HCL 25 MG/ML IJ SOLN
INTRAMUSCULAR | Status: AC
  Filled 2020-10-21: qty 1

## 2020-10-21 MED ORDER — MIDAZOLAM HCL 5 MG/5ML IJ SOLN
INTRAMUSCULAR | Status: DC | PRN
  Administered 2020-10-21 (×2): 1 mg via INTRAVENOUS

## 2020-10-21 MED ORDER — POVIDONE-IODINE 10 % EX SWAB
2.0000 "application " | Freq: Once | CUTANEOUS | Status: AC
  Administered 2020-10-21: 2 via TOPICAL

## 2020-10-21 SURGICAL SUPPLY — 20 items
CATH ROBINSON RED A/P 16FR (CATHETERS) ×4 IMPLANT
DECANTER SPIKE VIAL GLASS SM (MISCELLANEOUS) ×4 IMPLANT
FILTER UTR ASPR ASSEMBLY (MISCELLANEOUS) ×4 IMPLANT
GLOVE BIO SURGEON STRL SZ 6.5 (GLOVE) ×4 IMPLANT
GLOVE SURG UNDER POLY LF SZ7 (GLOVE) ×4 IMPLANT
GOWN STRL REUS W/ TWL LRG LVL3 (GOWN DISPOSABLE) ×6 IMPLANT
GOWN STRL REUS W/TWL LRG LVL3 (GOWN DISPOSABLE) ×2
HOSE CONNECTING 18IN BERKELEY (TUBING) ×4 IMPLANT
KIT BERKELEY 1ST TRI 3/8 NO TR (MISCELLANEOUS) ×4 IMPLANT
KIT BERKELEY 1ST TRIMESTER 3/8 (MISCELLANEOUS) ×4 IMPLANT
NS IRRIG 1000ML POUR BTL (IV SOLUTION) ×4 IMPLANT
PACK VAGINAL MINOR WOMEN LF (CUSTOM PROCEDURE TRAY) ×4 IMPLANT
PAD OB MATERNITY 4.3X12.25 (PERSONAL CARE ITEMS) ×4 IMPLANT
SET BERKELEY SUCTION TUBING (SUCTIONS) ×4 IMPLANT
TOWEL GREEN STERILE FF (TOWEL DISPOSABLE) ×8 IMPLANT
UNDERPAD 30X36 HEAVY ABSORB (UNDERPADS AND DIAPERS) ×4 IMPLANT
VACURETTE 10 RIGID CVD (CANNULA) ×4 IMPLANT
VACURETTE 7MM CVD STRL WRAP (CANNULA) IMPLANT
VACURETTE 8 RIGID CVD (CANNULA) IMPLANT
VACURETTE 9 RIGID CVD (CANNULA) IMPLANT

## 2020-10-21 NOTE — H&P (Signed)
35 year old female here for D and E for IUFD at 12 weeks.  Past Medical History:  Diagnosis Date  . Anemia   . Gestational diabetes   . PCOS (polycystic ovarian syndrome)    Past Surgical History:  Procedure Laterality Date  . CESAREAN SECTION N/A 03/29/2017   Procedure: CESAREAN SECTION;  Surgeon: Zelphia Cairo, MD;  Location: [facility] BIRTHING SUITES;  Service: Obstetrics;  Laterality: N/A;  . NO PAST SURGERIES     Prior to Admission medications   Medication Sig Start Date End Date Taking? Authorizing Provider  acetaminophen (TYLENOL) 325 MG tablet Take 2 tablets (650 mg total) every 4 (four) hours as needed by mouth for mild pain (for pain scale < 4). 04/02/17  Yes Harold Hedge, MD  metFORMIN (GLUCOPHAGE-XR) 750 MG 24 hr tablet Take 750 mg by mouth daily after supper.   Yes [provider]  Prenatal Vit-Fe Fumarate-FA (PRENATAL MULTIVITAMIN) TABS tablet Take 1 tablet by mouth daily at 12 noon.   Yes [provider]  ibuprofen (ADVIL,MOTRIN) 600 MG tablet Take 1 tablet (600 mg total) every 6 (six) hours as needed by mouth. 04/02/17   Harold Hedge, MD  oxyCODONE (ROXICODONE) 5 MG immediate release tablet Take 1 tablet (5 mg total) every 6 (six) hours as needed by mouth for severe pain. 04/02/17   Harold Hedge, MD   Allergies NKDA BP (!) 122/50   Pulse 69   Temp 98.5 F (36.9 C) (Oral)   Resp 17   Ht 4\' 11"  (1.499 m)   Wt 66.2 kg   SpO2 100%   BMI 29.48 kg/m  Results for orders placed or performed during the hospital encounter of 10/21/20 (from the past 24 hour(s))  Basic metabolic panel per protocol     Status: Abnormal   Collection Time: 10/21/20  5:57 AM  Result Value Ref Range   Sodium 134 (L) 135 - 145 mmol/L   Potassium 4.1 3.5 - 5.1 mmol/L   Chloride 106 98 - 111 mmol/L   CO2 22 22 - 32 mmol/L   Glucose, Bld 104 (H) 70 - 99 mg/dL   BUN 7 6 - 20 mg/dL   Creatinine, Ser 10/23/20 0.44 - 1.00 mg/dL   Calcium 9.0 8.9 - 9.50 mg/dL   GFR, Estimated 93.2 >67  mL/min   Anion gap 6 5 - 15  CBC     Status: None   Collection Time: 10/21/20  5:57 AM  Result Value Ref Range   WBC 7.2 4.0 - 10.5 K/uL   RBC 4.23 3.87 - 5.11 MIL/uL   Hemoglobin 12.7 12.0 - 15.0 g/dL   HCT 10/23/20 45.8 - 09.9 %   MCV 87.7 80.0 - 100.0 fL   MCH 30.0 26.0 - 34.0 pg   MCHC 34.2 30.0 - 36.0 g/dL   RDW 83.3 82.5 - 05.3 %   Platelets 174 150 - 400 K/uL   nRBC 0.0 0.0 - 0.2 %  Glucose, capillary     Status: Abnormal   Collection Time: 10/21/20  6:00 AM  Result Value Ref Range   Glucose-Capillary 102 (H) 70 - 99 mg/dL   Comment 1 Notify RN   Type and screen     Status: None (Preliminary result)   Collection Time: 10/21/20  6:13 AM  Result Value Ref Range   ABO/RH(D) PENDING    Antibody Screen PENDING    Sample Expiration      10/24/2020,2359 Performed at [facility], [address] [place],  [place] [address]    General alert and oriented Lung CTAB Car RRR Abdomen is soft and non tender  IMPRESSION: IUFD at 12 weeks  PLAN: D and E with ultrasound guidance  Chromosome analysis  Risks reviewed Consent is signed

## 2020-10-21 NOTE — Brief Op Note (Signed)
10/21/2020  8:10 AM  PATIENT:  Wanda Roberts  35 y.o. female  PRE-OPERATIVE DIAGNOSIS: Missed AB at 13 weeks  POST-OPERATIVE DIAGNOSIS:  Same  PROCEDURE:  Procedure(s): DILATATION AND EVACUATION (N/A) OPERATIVE ULTRASOUND (N/A) CHROMOSOME STUDIES  SURGEON:  Surgeon(s) and Role:    * Dian Queen, MD - Primary  PHYSICIAN ASSISTANT:   ASSISTANTS: none   ANESTHESIA:   MAC  EBL: 200 cc  BLOOD ADMINISTERED:none  DRAINS: none   LOCAL MEDICATIONS USED:  NONE  SPECIMEN:  Source of Specimen:  POC   DISPOSITION OF SPECIMEN:  PATHOLOGY  COUNTS:  YES  TOURNIQUET:  * No tourniquets in log *  DICTATION: .Other Dictation: Dictation Number dictated  PLAN OF CARE: Discharge to home after PACU  PATIENT DISPOSITION:  PACU - hemodynamically stable.   Delay start of Pharmacological VTE agent (>24hrs) due to surgical blood loss or risk of bleeding: not applicable

## 2020-10-21 NOTE — Transfer of Care (Signed)
Immediate Anesthesia Transfer of Care Note  Patient: Wanda Roberts  Procedure(s) Performed: DILATATION AND EVACUATION (N/A Vagina ) OPERATIVE ULTRASOUND (N/A ) CHROMOSOME STUDIES  Patient Location: PACU  Anesthesia Type:General  Level of Consciousness: awake, alert  and patient cooperative  Airway & Oxygen Therapy: Patient Spontanous Breathing and Patient connected to face mask oxygen  Post-op Assessment: Report given to RN and Post -op Vital signs reviewed and stable  Post vital signs: Reviewed  Last Vitals:  Vitals Value Taken Time  BP 119/91 10/21/20 0829  Temp    Pulse 123 10/21/20 0833  Resp 20 10/21/20 0833  SpO2 100 % 10/21/20 0833  Vitals shown include unvalidated device data.  Last Pain:  Vitals:   10/21/20 0605  TempSrc:   PainSc: 2       Patients Stated Pain Goal: 3 (10/21/20 4599)  Complications: No complications documented.

## 2020-10-21 NOTE — Anesthesia Procedure Notes (Signed)
Procedure Name: LMA Insertion Date/Time: 10/21/2020 7:41 AM Performed by: Audie Pinto, CRNA Pre-anesthesia Checklist: Patient identified, Emergency Drugs available, Suction available and Patient being monitored Patient Re-evaluated:Patient Re-evaluated prior to induction Oxygen Delivery Method: Circle system utilized Preoxygenation: Pre-oxygenation with 100% oxygen Induction Type: IV induction LMA: LMA inserted LMA Size: 3.0 Placement Confirmation: positive ETCO2 Dental Injury: Teeth and Oropharynx as per pre-operative assessment

## 2020-10-21 NOTE — Anesthesia Postprocedure Evaluation (Signed)
Anesthesia Post Note  Patient: Wanda Roberts  Procedure(s) Performed: DILATATION AND EVACUATION (N/A Vagina ) OPERATIVE ULTRASOUND (N/A Pelvis) CHROMOSOME STUDIES     Patient location during evaluation: PACU Anesthesia Type: General Level of consciousness: awake and alert Pain management: pain level controlled Vital Signs Assessment: post-procedure vital signs reviewed and stable Respiratory status: spontaneous breathing, nonlabored ventilation, respiratory function stable and patient connected to nasal cannula oxygen Cardiovascular status: blood pressure returned to baseline and stable Postop Assessment: no apparent nausea or vomiting Anesthetic complications: no   No complications documented.  Last Vitals:  Vitals:   10/21/20 0930 10/21/20 0951  BP: 103/68 109/70  Pulse: 69 73  Resp: 15 12  Temp:  36.9 C  SpO2: 99% 100%    Last Pain:  Vitals:   10/21/20 0830  TempSrc:   PainSc: 0-No pain                 Earl Lites P Ahsan Esterline

## 2020-10-21 NOTE — Op Note (Signed)
Wanda Roberts, NESSER MEDICAL RECORD NO: [id number] ACCOUNT NO: [id number] DATE OF BIRTH: [birth year] FACILITY: [facility] LOCATION: [facility] PHYSICIAN: Caine Barfield L. Vincente Poli, MD  Operative Report   DATE OF PROCEDURE: 10/21/2020  PREOPERATIVE DIAGNOSIS:  Missed abortion at 11 weeks.  POSTOPERATIVE DIAGNOSIS:  Missed abortion at 11 weeks.  PROCEDURE PERFORMED:  D and E with ultrasound guidance with chromosomal analysis.  DESCRIPTION OF PROCEDURE:  The patient was taken to the operating room.  She was administered anesthesia.  She was prepped and draped in the usual sterile fashion.  She was then placed in the lithotomy position, after she was prepped and draped in the  sterile fashion an in and out catheter was used to empty the bladder.  The cervix was noted already to be dilated.  She has been given Cytotec the night before and her water had broken this morning, so I did not even have to dilate her cervix because it  was very soft.  Using ultrasound guidance, we inserted the #10 suction cannula and thoroughly suctioned all tissue, given her water had been broken, the tissue and the products of conception cannot be acquired easily.  We used tissue forceps to remove  the rest.  I also used a sharp curette to remove the placenta.  Ultrasound confirmed the uterine cavity was clean.  There was a little bit of atony at the end and I have instructed the anesthesia team to administer Methergine.  All sponge, lap and  instrument counts were correct x2 at the end of the case.  She went to recovery room in stable condition.     [email or web address] D: 10/21/2020 11:21:36 am T: 10/21/2020 11:01:00 pm  JOB: [id number]/ [id number]

## 2020-10-22 ENCOUNTER — Encounter (HOSPITAL_COMMUNITY): Payer: Self-pay | Admitting: Obstetrics and Gynecology

## 2020-10-25 LAB — SURGICAL PATHOLOGY

## 2021-11-13 ENCOUNTER — Other Ambulatory Visit: Payer: Self-pay | Admitting: Obstetrics and Gynecology

## 2021-11-13 DIAGNOSIS — R928 Other abnormal and inconclusive findings on diagnostic imaging of breast: Secondary | ICD-10-CM

## 2021-11-14 ENCOUNTER — Ambulatory Visit
Admission: RE | Admit: 2021-11-14 | Discharge: 2021-11-14 | Disposition: A | Discharge status: Home / Self Care | Payer: 59 | Source: Ambulatory Visit | Attending: Obstetrics and Gynecology | Admitting: Obstetrics and Gynecology

## 2021-11-14 DIAGNOSIS — R928 Other abnormal and inconclusive findings on diagnostic imaging of breast: Secondary | ICD-10-CM

## 2023-09-17 IMAGING — MG MM DIGITAL DIAGNOSTIC UNILAT*L* W/ TOMO W/ CAD
4 series · 4 of 12 positions shown · non-contrast
Comparison: 11/08/2021 baseline mammogram

CLINICAL DATA: 35-year-old female for further evaluation of
possible LEFT breast asymmetry/mass on baseline screening mammogram.
Family history of breast cancer.

EXAM:
DIGITAL DIAGNOSTIC UNILATERAL LEFT MAMMOGRAM WITH TOMOSYNTHESIS AND
CAD; ULTRASOUND LEFT BREAST LIMITED
TECHNIQUE: Left digital diagnostic mammography and breast tomosynthesis was
performed. The images were evaluated with computer-aided detection.;
Targeted ultrasound examination of the left breast was performed.

[L CC synth-2D]
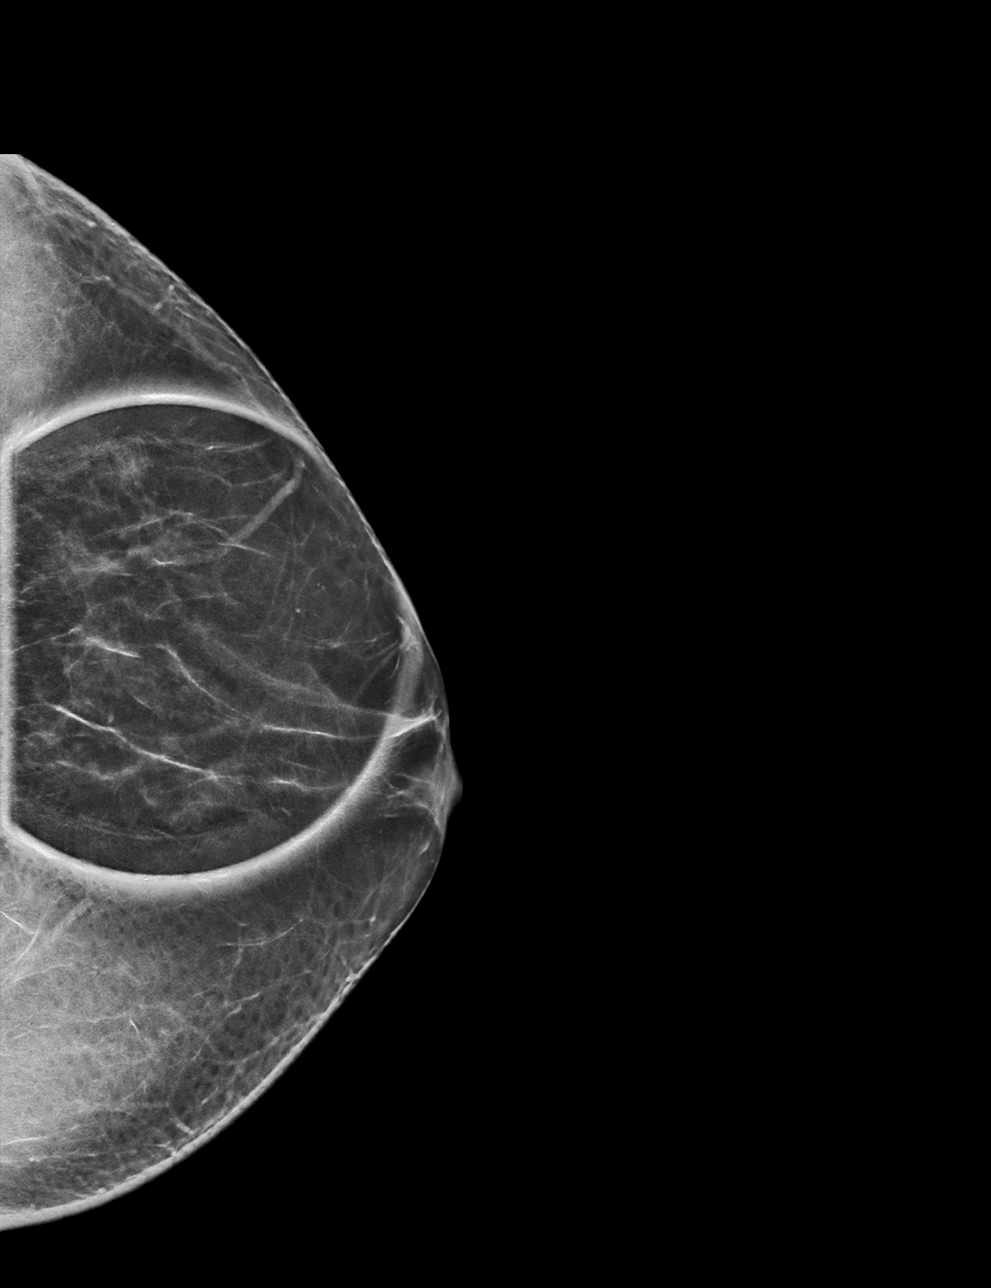

[L MLO synth-2D]
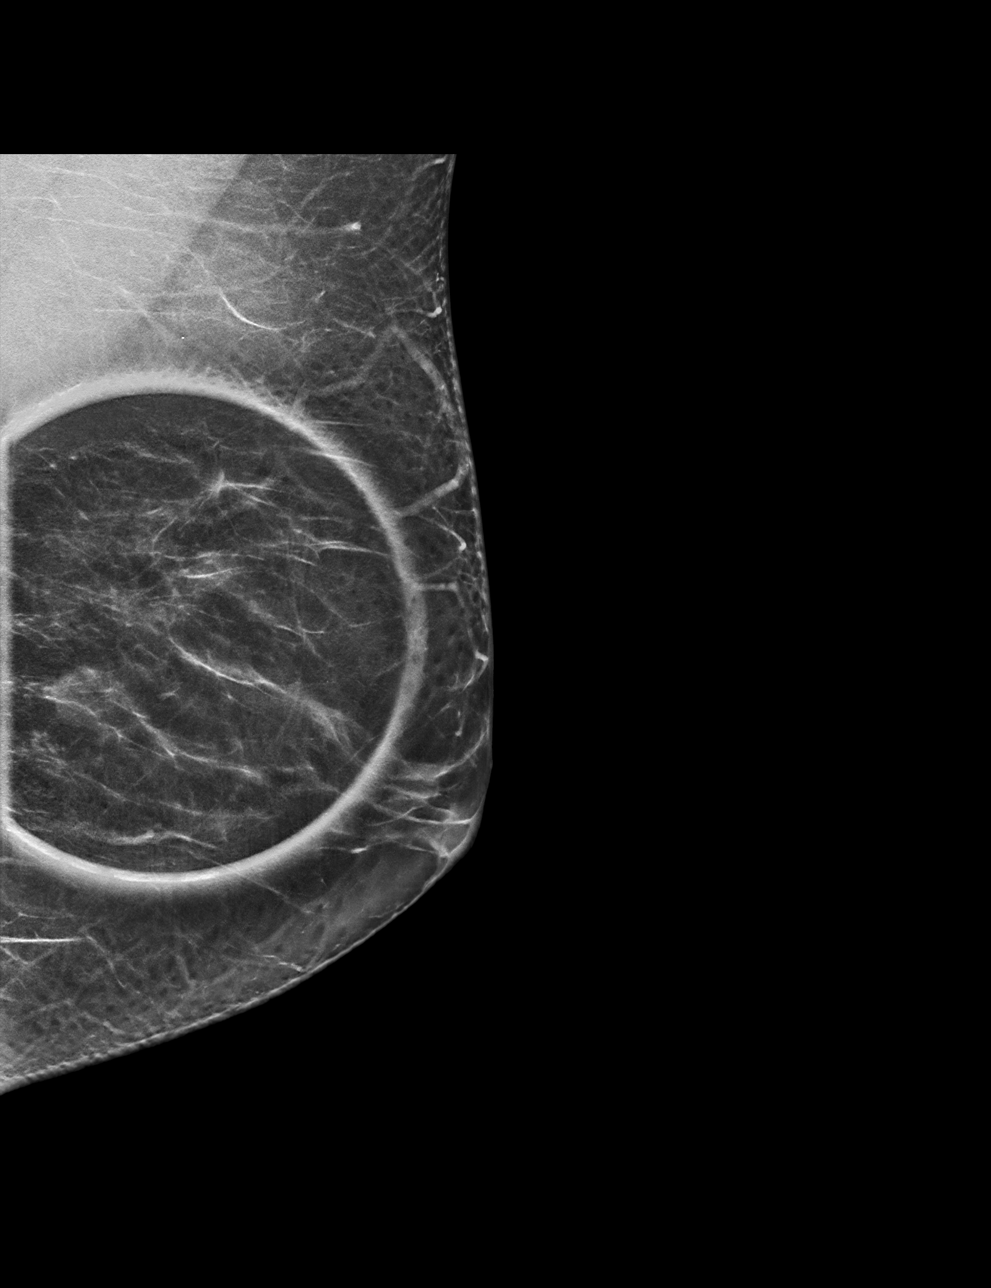

[L MLO tomo · tomo slice 34/67.0]
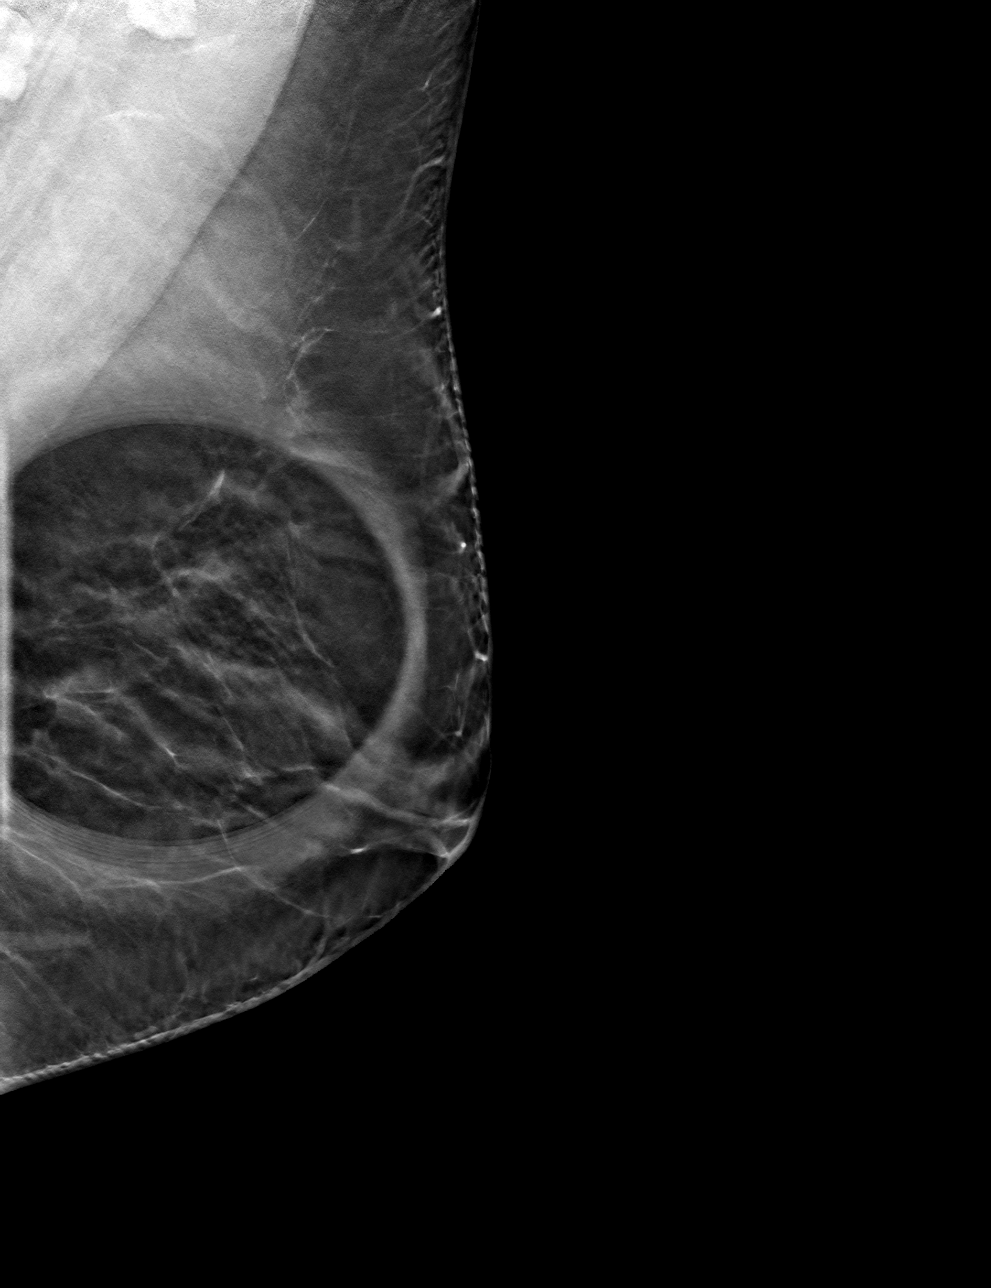

[L CC tomo · tomo slice 29/56.0]
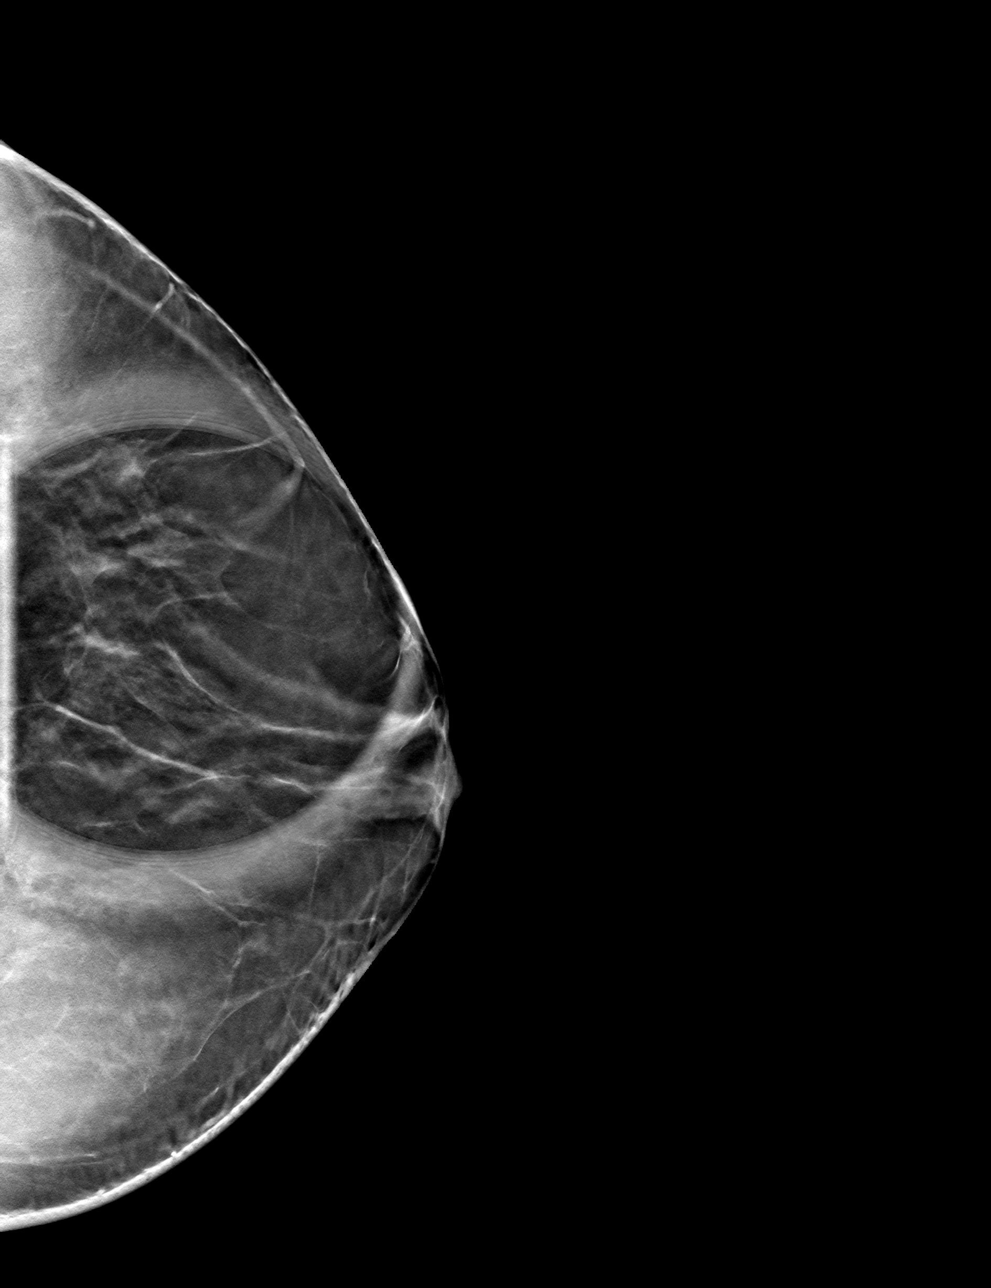

[4 of 12 positions shown; findings below may reference images not displayed]

ACR Breast Density Category b: There are scattered areas of
fibroglandular density.
FINDINGS: 2D/3D spot compression views of the LEFT breast demonstrate
dispersal of the screening study asymmetry without persistent
suspicious abnormality in this area.

Targeted ultrasound is performed, showing no sonographic abnormality
within the LEFT breast
IMPRESSION: 1. No persistent suspicious mammographic or sonographic abnormality
within the LEFT breast.

RECOMMENDATION:
Bilateral screening mammogram in 1 year given close family history
of breast cancer.

I have discussed the findings and recommendations with the patient.
If applicable, a reminder letter will be sent to the patient
regarding the next appointment.

BI-RADS CATEGORY  1: Negative.

## 2023-09-17 IMAGING — US US BREAST*L* LIMITED INC AXILLA
1 series · 7 of 7 positions shown · non-contrast
Comparison: 11/08/2021 baseline mammogram

CLINICAL DATA: 35-year-old female for further evaluation of
possible LEFT breast asymmetry/mass on baseline screening mammogram.
Family history of breast cancer.

EXAM:
DIGITAL DIAGNOSTIC UNILATERAL LEFT MAMMOGRAM WITH TOMOSYNTHESIS AND
CAD; ULTRASOUND LEFT BREAST LIMITED
TECHNIQUE: Left digital diagnostic mammography and breast tomosynthesis was
performed. The images were evaluated with computer-aided detection.;
Targeted ultrasound examination of the left breast was performed.

[Series 1: us breast*left* limited inc axilla · 0.07mm/px · 7 of 7 slices shown]
[im 1/7]
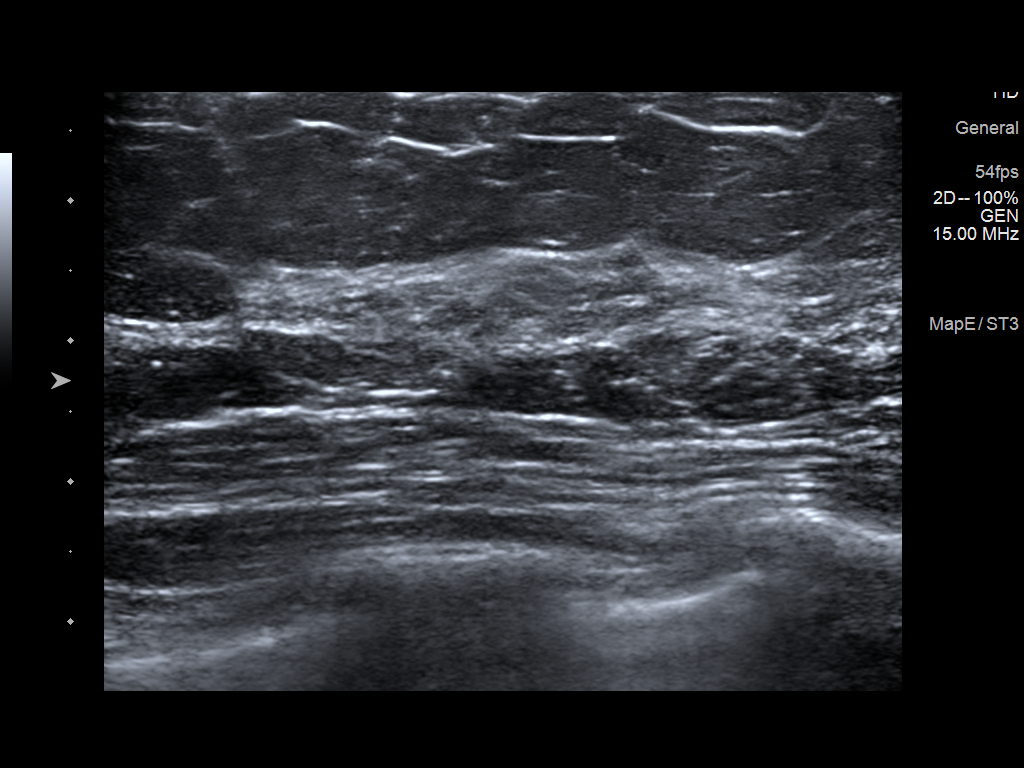
[im 2/7]
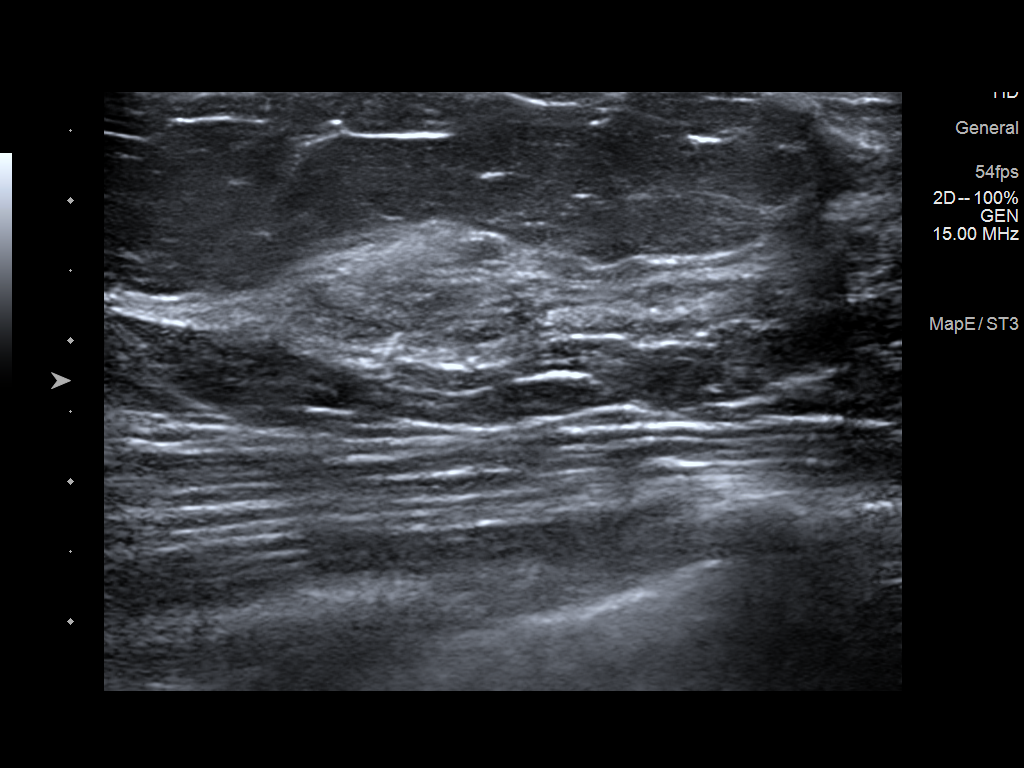
[im 3/7]
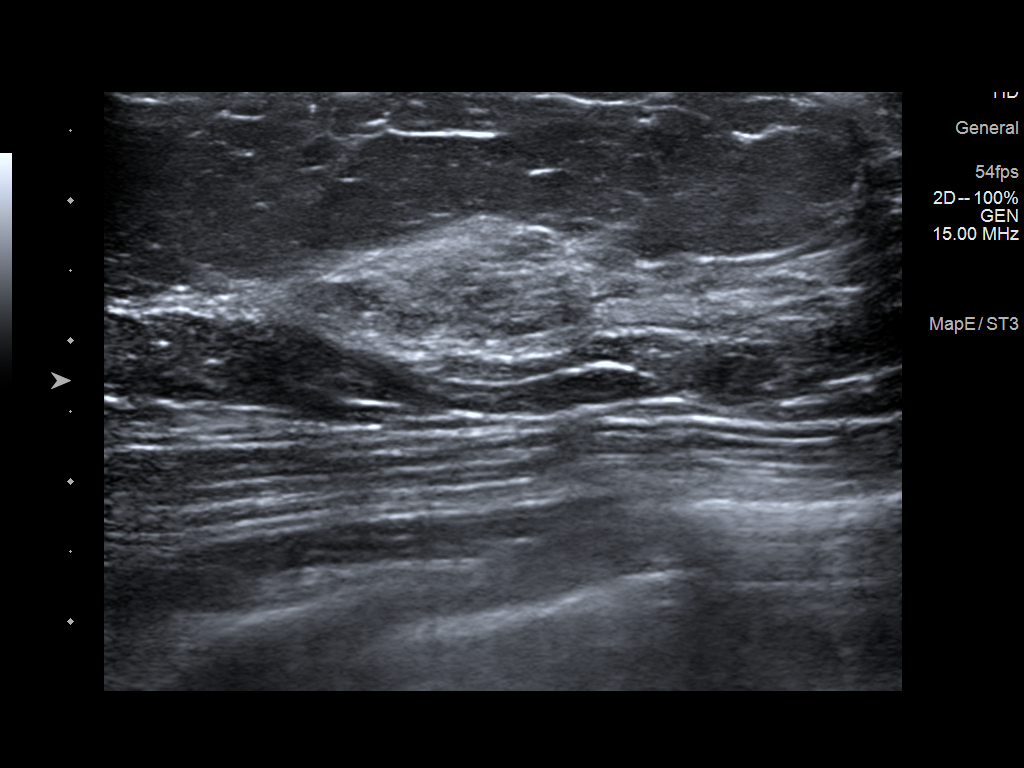
[im 4/7]
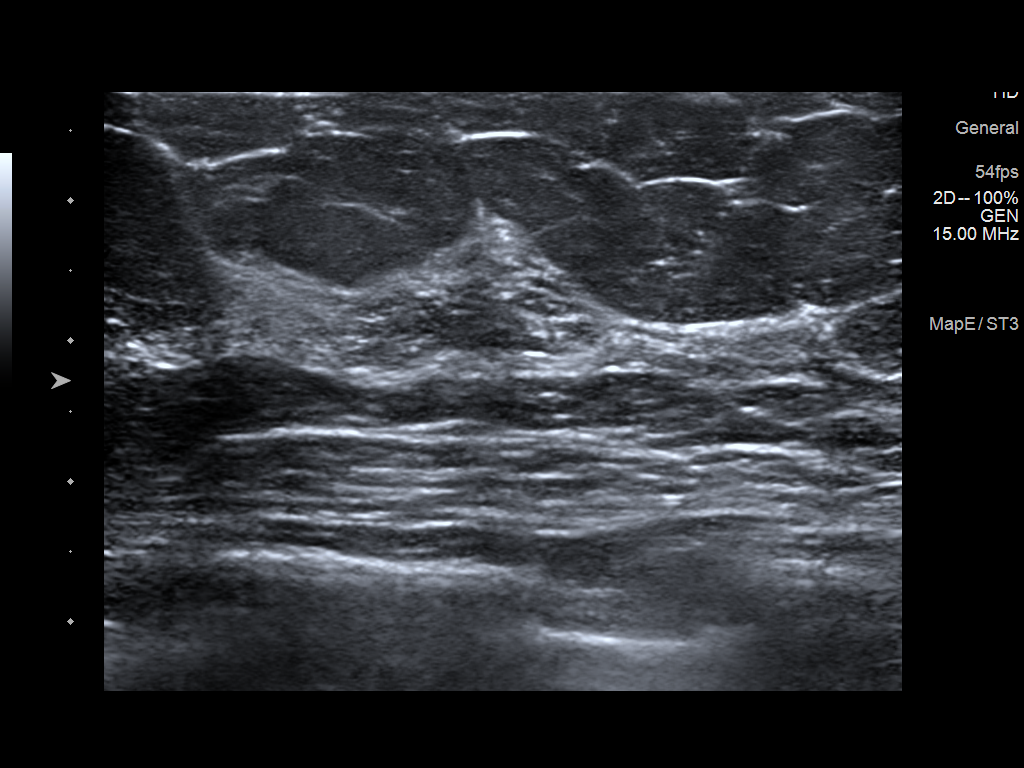
[im 5/7]
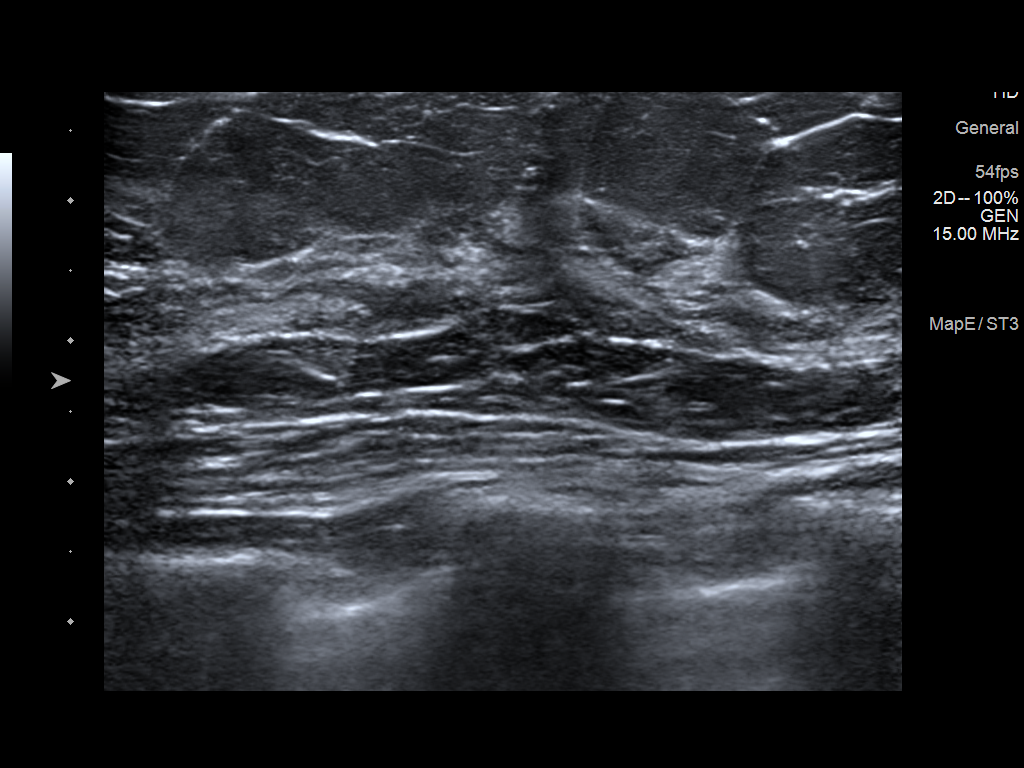
[im 6/7]
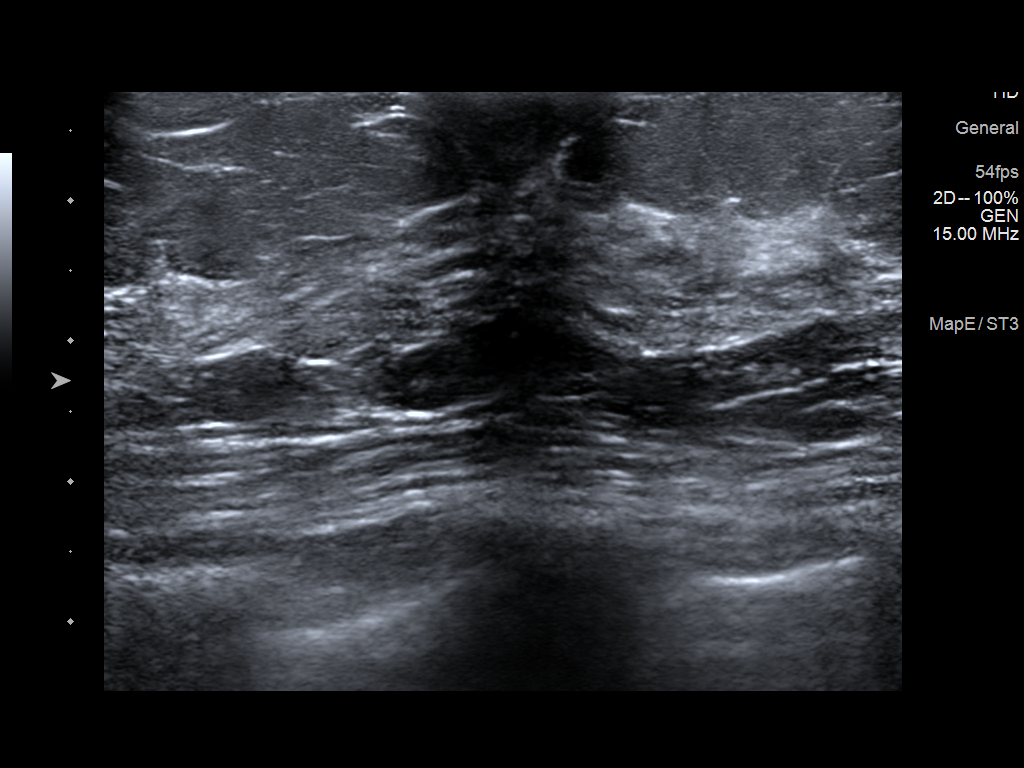
[im 7/7]
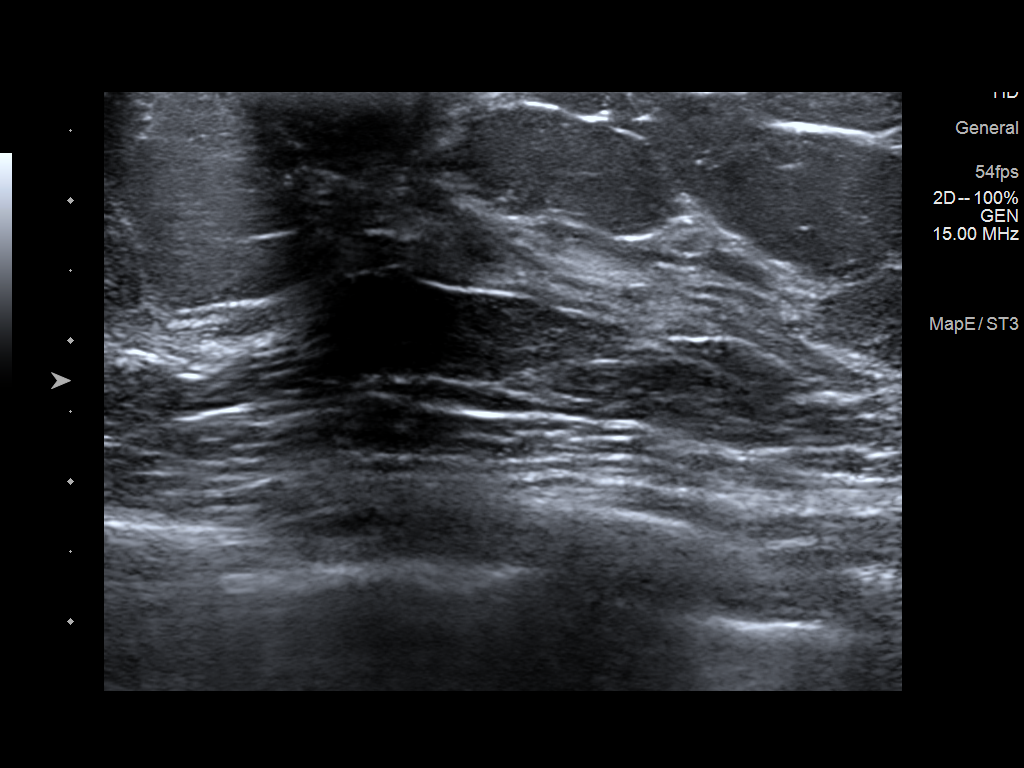

[7 of 7 positions shown; findings below may reference images not displayed]

ACR Breast Density Category b: There are scattered areas of
fibroglandular density.
FINDINGS: 2D/3D spot compression views of the LEFT breast demonstrate
dispersal of the screening study asymmetry without persistent
suspicious abnormality in this area.

Targeted ultrasound is performed, showing no sonographic abnormality
within the LEFT breast
IMPRESSION: 1. No persistent suspicious mammographic or sonographic abnormality
within the LEFT breast.

RECOMMENDATION:
Bilateral screening mammogram in 1 year given close family history
of breast cancer.

I have discussed the findings and recommendations with the patient.
If applicable, a reminder letter will be sent to the patient
regarding the next appointment.

BI-RADS CATEGORY  1: Negative.
